# Patient Record
Sex: Male | Born: 1960 | Race: Black or African American | Hispanic: No | Marital: Married | Smoking: Current every day smoker
Health system: Southern US, Community
[De-identification: ages and names within clinical notes are randomized; demographics above are authoritative.]

## PROBLEM LIST (undated history)

## (undated) DIAGNOSIS — I639 Cerebral infarction, unspecified: Secondary | ICD-10-CM

## (undated) DIAGNOSIS — M519 Unspecified thoracic, thoracolumbar and lumbosacral intervertebral disc disorder: Secondary | ICD-10-CM

---

## 2013-05-10 ENCOUNTER — Encounter (HOSPITAL_COMMUNITY): Payer: Self-pay | Admitting: Emergency Medicine

## 2013-05-10 ENCOUNTER — Emergency Department (HOSPITAL_COMMUNITY)
Admission: EM | Admit: 2013-05-10 | Discharge: 2013-05-10 | Disposition: A | Discharge status: Home / Self Care | Payer: Self-pay | Attending: Emergency Medicine | Admitting: Emergency Medicine

## 2013-05-10 DIAGNOSIS — Z8739 Personal history of other diseases of the musculoskeletal system and connective tissue: Secondary | ICD-10-CM | POA: Insufficient documentation

## 2013-05-10 DIAGNOSIS — T63461A Toxic effect of venom of wasps, accidental (unintentional), initial encounter: Secondary | ICD-10-CM | POA: Insufficient documentation

## 2013-05-10 DIAGNOSIS — Y9389 Activity, other specified: Secondary | ICD-10-CM | POA: Insufficient documentation

## 2013-05-10 DIAGNOSIS — F17200 Nicotine dependence, unspecified, uncomplicated: Secondary | ICD-10-CM | POA: Insufficient documentation

## 2013-05-10 DIAGNOSIS — T6391XA Toxic effect of contact with unspecified venomous animal, accidental (unintentional), initial encounter: Secondary | ICD-10-CM | POA: Insufficient documentation

## 2013-05-10 DIAGNOSIS — J029 Acute pharyngitis, unspecified: Secondary | ICD-10-CM | POA: Insufficient documentation

## 2013-05-10 DIAGNOSIS — Y929 Unspecified place or not applicable: Secondary | ICD-10-CM | POA: Insufficient documentation

## 2013-05-10 HISTORY — DX: Unspecified thoracic, thoracolumbar and lumbosacral intervertebral disc disorder: M51.9

## 2013-05-10 MED ORDER — PREDNISONE 10 MG PO TABS
60.0000 mg | ORAL_TABLET | Freq: Once | ORAL | Status: AC
  Administered 2013-05-10: 22:00:00 60 mg via ORAL
  Filled 2013-05-10: qty 1

## 2013-05-10 MED ORDER — DIPHENHYDRAMINE HCL 25 MG PO CAPS
50.0000 mg | ORAL_CAPSULE | Freq: Once | ORAL | Status: AC
  Administered 2013-05-10: 50 mg via ORAL
  Filled 2013-05-10: qty 2

## 2013-05-10 NOTE — ED Notes (Signed)
Patient's wife states patient was out with some friends drinking and got multiple yellow jacket stings.

## 2013-05-10 NOTE — ED Provider Notes (Signed)
CSN: [id number]     Arrival date & time 05/10/13  2121 History  This chart was scribed for Geoffery Lyons, MD by Clydene Laming, ED Scribe. This patient was seen in room  and the patient's care was started at 9:30 PM.   Chief Complaint  Patient presents with  . Insect Bite   The history is provided by the patient. No language interpreter was used.     HPI Comments: Fred Bell is a 52 y.o. male who presents to the Emergency Department complaining of multiple yellow jacket stings that occurred tonight while out drinking with some friends. Pt states he is experiencing some trouble breathing with a swollen throat. Pt states a previous episode of stings but was not this severe. Pt states he does not want to be treated with any needles. Pt states he prefers medication pills.       Past Medical History  Diagnosis Date  . Lumbar disc disease    History reviewed. No pertinent past surgical history. No family history on file. History  Substance Use Topics  . Smoking status: Current Every Day Smoker  . Smokeless tobacco: Not on file  . Alcohol Use: Yes    Review of Systems  HENT: Positive for sore throat. Negative for neck pain.   Respiratory: Negative for choking and shortness of breath.   Skin: Positive for wound. Negative for rash.  Neurological: Negative for dizziness, numbness and headaches.  All other systems reviewed and are negative.    Allergies  Review of patient's allergies indicates no known allergies.  Home Medications  No current outpatient prescriptions on file.  Triage Vitals: BP 166/109  Pulse 87  Temp(Src) 98.4 F (36.9 C) (Oral)  Resp 18  Ht 6' (1.829 m)  Wt 180 lb (81.647 kg)  BMI 24.41 kg/m2  SpO2 99% Physical Exam  Nursing note and vitals reviewed. Constitutional: He is oriented to person, place, and time. He appears well-developed and well-nourished. No distress.  HENT:  Head: Normocephalic and atraumatic.  Eyes: Conjunctivae and EOM are normal.   Neck: Normal range of motion. Neck supple. No tracheal deviation present.  Cardiovascular: Normal rate, regular rhythm and normal heart sounds.   Pulmonary/Chest: Effort normal and breath sounds normal.  Musculoskeletal: Normal range of motion. He exhibits no edema.  Neurological: He is alert and oriented to person, place, and time. No cranial nerve deficit.  Skin: Skin is warm and dry.  Psychiatric: He has a normal mood and affect. His behavior is normal.    ED Course  Procedures (including critical care time)  DIAGNOSTIC STUDIES: Oxygen Saturation is 99% on RA, normal by my interpretation.    COORDINATION OF CARE: 9:35 PM- Discussed treatment plan with pt at bedside. Pt verbalized understanding and agreement with plan.   Labs Review Labs Reviewed - No data to display Imaging Review No results found.  MDM  No diagnosis found. Patient is a 52 year old male who presents with complaints of multiple bee stings to the back of his head and neck. He states that he feels like his throat is swelling. He presents here obviously intoxicated and having no difficulty speaking and no stridor. His vital signs are stable and he is showing no signs of anaphylaxis. As he was complaining of throat swelling I had intended to give IV steroids and antihistamines. He adamantly refused this and these medications were given orally. He was watched for a period of over an hour and is situation did not worsen. At this point I  feel as though he is stable for discharge, to return when necessary for any problems.  I personally performed the services described in this documentation, which was scribed in my presence. The recorded information has been reviewed and is accurate.     Geoffery Lyons, MD 05/10/13 2249

## 2014-11-28 ENCOUNTER — Inpatient Hospital Stay (HOSPITAL_COMMUNITY)
Admission: EM | Admit: 2014-11-28 | Discharge: 2014-11-30 | DRG: 065 | Disposition: A | Discharge status: Home Health | Payer: Medicaid Other | Attending: Family Medicine | Admitting: Family Medicine

## 2014-11-28 ENCOUNTER — Observation Stay (HOSPITAL_COMMUNITY): Payer: Medicaid Other

## 2014-11-28 ENCOUNTER — Encounter (HOSPITAL_COMMUNITY): Payer: Self-pay

## 2014-11-28 ENCOUNTER — Emergency Department (HOSPITAL_COMMUNITY): Payer: Medicaid Other

## 2014-11-28 DIAGNOSIS — I639 Cerebral infarction, unspecified: Secondary | ICD-10-CM | POA: Diagnosis present

## 2014-11-28 DIAGNOSIS — F1010 Alcohol abuse, uncomplicated: Secondary | ICD-10-CM | POA: Diagnosis present

## 2014-11-28 DIAGNOSIS — R531 Weakness: Secondary | ICD-10-CM | POA: Diagnosis present

## 2014-11-28 DIAGNOSIS — Z720 Tobacco use: Secondary | ICD-10-CM | POA: Diagnosis present

## 2014-11-28 DIAGNOSIS — F17210 Nicotine dependence, cigarettes, uncomplicated: Secondary | ICD-10-CM | POA: Diagnosis present

## 2014-11-28 DIAGNOSIS — F70 Mild intellectual disabilities: Secondary | ICD-10-CM | POA: Diagnosis present

## 2014-11-28 DIAGNOSIS — R29810 Facial weakness: Secondary | ICD-10-CM | POA: Diagnosis present

## 2014-11-28 DIAGNOSIS — G8194 Hemiplegia, unspecified affecting left nondominant side: Secondary | ICD-10-CM | POA: Diagnosis present

## 2014-11-28 DIAGNOSIS — R1310 Dysphagia, unspecified: Secondary | ICD-10-CM

## 2014-11-28 DIAGNOSIS — I63511 Cerebral infarction due to unspecified occlusion or stenosis of right middle cerebral artery: Principal | ICD-10-CM | POA: Diagnosis present

## 2014-11-28 DIAGNOSIS — F1210 Cannabis abuse, uncomplicated: Secondary | ICD-10-CM | POA: Diagnosis present

## 2014-11-28 LAB — COMPREHENSIVE METABOLIC PANEL
ALBUMIN: 4.5 g/dL (ref 3.5–5.2)
ALT: 24 U/L (ref 0–53)
AST: 49 U/L — AB (ref 0–37)
Alkaline Phosphatase: 78 U/L (ref 39–117)
Anion gap: 10 (ref 5–15)
BILIRUBIN TOTAL: 0.8 mg/dL (ref 0.3–1.2)
BUN: 9 mg/dL (ref 6–23)
CHLORIDE: 108 mmol/L (ref 96–112)
CO2: 21 mmol/L (ref 19–32)
CREATININE: 0.91 mg/dL (ref 0.50–1.35)
Calcium: 9.1 mg/dL (ref 8.4–10.5)
GFR calc Af Amer: >90 mL/min (ref >90)
GFR calc non Af Amer: >90 mL/min (ref >90)
Glucose, Bld: 86 mg/dL (ref 70–99)
Potassium: 4.2 mmol/L (ref 3.5–5.1)
Sodium: 139 mmol/L (ref 135–145)
Total Protein: 7.6 g/dL (ref 6.0–8.3)

## 2014-11-28 LAB — LIPID PANEL
Cholesterol: 161 mg/dL (ref 0–200)
HDL: 71 mg/dL (ref >39)
LDL Cholesterol: 81 mg/dL (ref 0–99)
Total CHOL/HDL Ratio: 2.3 RATIO
Triglycerides: 45 mg/dL (ref <150)
VLDL: 9 mg/dL (ref 0–40)

## 2014-11-28 LAB — PROTIME-INR
INR: 0.98 (ref 0.00–1.49)
PROTHROMBIN TIME: 13.1 s (ref 11.6–15.2)

## 2014-11-28 LAB — CBC
HCT: 42.4 % (ref 39.0–52.0)
HEMOGLOBIN: 14.3 g/dL (ref 13.0–17.0)
MCH: 30.7 pg (ref 26.0–34.0)
MCHC: 33.7 g/dL (ref 30.0–36.0)
MCV: 91 fL (ref 78.0–100.0)
PLATELETS: 200 10*3/uL (ref 150–400)
RBC: 4.66 MIL/uL (ref 4.22–5.81)
RDW: 13.6 % (ref 11.5–15.5)
WBC: 6.7 10*3/uL (ref 4.0–10.5)

## 2014-11-28 LAB — DIFFERENTIAL
BASOS ABS: 0 10*3/uL (ref 0.0–0.1)
BASOS PCT: 0 % (ref 0–1)
EOS ABS: 0 10*3/uL (ref 0.0–0.7)
Eosinophils Relative: 0 % (ref 0–5)
Lymphocytes Relative: 28 % (ref 12–46)
Lymphs Abs: 1.8 10*3/uL (ref 0.7–4.0)
MONOS PCT: 6 % (ref 3–12)
Monocytes Absolute: 0.4 10*3/uL (ref 0.1–1.0)
NEUTROS ABS: 4.4 10*3/uL (ref 1.7–7.7)
NEUTROS PCT: 66 % (ref 43–77)

## 2014-11-28 LAB — URINALYSIS, ROUTINE W REFLEX MICROSCOPIC
Bilirubin Urine: NEGATIVE
GLUCOSE, UA: NEGATIVE mg/dL
HGB URINE DIPSTICK: NEGATIVE
KETONES UR: 15 mg/dL — AB
LEUKOCYTES UA: NEGATIVE
Nitrite: NEGATIVE
PROTEIN: NEGATIVE mg/dL
Specific Gravity, Urine: >1.03 — ABNORMAL HIGH (ref 1.005–1.030)
UROBILINOGEN UA: 0.2 mg/dL (ref 0.0–1.0)
pH: 5.5 (ref 5.0–8.0)

## 2014-11-28 LAB — ETHANOL: Alcohol, Ethyl (B): 18 mg/dL — ABNORMAL HIGH (ref 0–9)

## 2014-11-28 LAB — RAPID URINE DRUG SCREEN, HOSP PERFORMED
Amphetamines: NOT DETECTED
BARBITURATES: NOT DETECTED
BENZODIAZEPINES: NOT DETECTED
COCAINE: NOT DETECTED
Opiates: NOT DETECTED
TETRAHYDROCANNABINOL: POSITIVE — AB

## 2014-11-28 LAB — APTT: APTT: 26 s (ref 24–37)

## 2014-11-28 LAB — I-STAT TROPONIN, ED: TROPONIN I, POC: 0 ng/mL (ref 0.00–0.08)

## 2014-11-28 MED ORDER — SODIUM CHLORIDE 0.9 % IV SOLN
INTRAVENOUS | Status: DC
  Administered 2014-11-28: 13:00:00 via INTRAVENOUS

## 2014-11-28 MED ORDER — LORAZEPAM 2 MG/ML IJ SOLN: 1.0000 mg | Freq: Once | INTRAMUSCULAR | Status: DC

## 2014-11-28 MED ORDER — ASPIRIN 300 MG RE SUPP
325.0000 mg | Freq: Once | RECTAL | Status: AC
  Administered 2014-11-28: 300 mg via RECTAL
  Filled 2014-11-28: qty 1

## 2014-11-28 MED ORDER — SIMVASTATIN 20 MG PO TABS
20.0000 mg | ORAL_TABLET | Freq: Every day | ORAL | Status: DC
  Administered 2014-11-29: 20 mg via ORAL
  Filled 2014-11-28 (×2): qty 1

## 2014-11-28 MED ORDER — SENNOSIDES-DOCUSATE SODIUM 8.6-50 MG PO TABS: 1.0000 | ORAL_TABLET | Freq: Every evening | ORAL | Status: DC | PRN

## 2014-11-28 MED ORDER — STROKE: EARLY STAGES OF RECOVERY BOOK
Freq: Once | Status: AC
  Administered 2014-11-28: 18:00:00
  Filled 2014-11-28: qty 1

## 2014-11-28 MED ORDER — ASPIRIN 325 MG PO TABS
325.0000 mg | ORAL_TABLET | Freq: Every day | ORAL | Status: DC
  Filled 2014-11-28: qty 1

## 2014-11-28 NOTE — Plan of Care (Signed)
Problem: Consults Goal: Ischemic Stroke Patient Education See Patient Education Module for education specifics.  Outcome: Completed/Met Date Met:  11/28/14 Stroke booklet given to patient and discussed with patient's spouse.

## 2014-11-28 NOTE — Progress Notes (Signed)
Stroke swallow screen repeated per SLP and NP recommendation. Patient was able to tolerate fluids, but choked on cracker. Will keep patient NPO until SLP can see him.

## 2014-11-28 NOTE — H&P (Signed)
[facility] History and Physical  Fred RedoOwen Tadesse [id number] DOB: 04/23/1961 DOA: 11/28/2014  Referring physician:  PCP: Toma DeitersHASANAJ,XAJE A, MD   Chief Complaint: left facial droop slurred speech left arm weakness  HPI: Fred Bell is a 54 y.o. male with no medical hx presents to ED with cc left sided weakness with facial droop. Initial evaluation in the emergency department reveals an acute right MCA infarct with minimal to mild mass effect right lateral ventricle and trace midline shift.  Patient reports he awakened this morning got out of bed slipped on some water and fell and hit his head. He denies having difficulty getting up off the floor and reports that he went back to bed without pain. About 7 AM his wife came home after working the night shift and immediately noticed the left side of his face was drooping and his speech was somewhat slurred. He got up to go take a shower his gait was unsteady in his left arm was limp by his side. She called EMS he was transported to the emergency department. He denies chest pain palpitations headache visual does disturbances syncope or near-syncope. He denies abdominal pain nausea vomiting diarrhea. He does report he drinks an average of 1-2 beers per day and that last night he might have had "more than a couple of beers". He admits to occasional THC use but denies cocaine crack and heroin. He does smoke. Time of onset of symptoms is unknown but he was last known well at 6 PM yesterday. Her cup in the emergency department includes comprehensive metabolic panel that is unremarkable, CBC with differential is unremarkable EKG on this rhythm biatrial enlargement and CT of the head as noted above. He is hemodynamically stable afebrile and not hypoxic .  Review of Systems:  10 point review of systems complete and all systems are negative except as indicated in the history of present illness  Past Medical History  Diagnosis Date  . Lumbar disc disease     History reviewed. No pertinent past surgical history. Social History:  reports that he has been smoking.  He does not have any smokeless tobacco history on file. He reports that he drinks alcohol. He reports that he uses illicit drugs (Marijuana). Married lives with wife is in [employer] independent with ADLs No Known Allergies  No family history on file. others deceased of unknown causes mother alive 52 years old history of hypertension diabetes has 2 siblings neither of which have a past medical history  Prior to Admission medications   Medication Sig Start Date End Date Taking? Authorizing Provider  traMADol (ULTRAM) 50 MG tablet Take 50 mg by mouth every 6 (six) hours as needed for pain.   Yes Historical Provider, MD   Physical Exam: Filed Vitals:   11/28/14 0844 11/28/14 0915 11/28/14 0930 11/28/14 1000  BP:  123/92 119/80 118/61  Pulse:  53 54 56  Temp: 98.8 F (37.1 C)     TempSrc:      Resp:  15 13 14   Height:      Weight:      SpO2:  100% 100% 99%    Wt Readings from Last 3 Encounters:  11/28/14 81.647 kg (180 lb)  05/10/13 81.647 kg (180 lb)    General:  Appears calm and comfortable Eyes: PERRL, normal lids, irises & conjunctiva ENT: grossly normal hearing, lips & tongue his membranes of his mouth are moist and pink Neck: no LAD, masses or thyromegaly Cardiovascular: RRR, no m/r/g.  No LE edema.  Respiratory: CTA bilaterally, no w/r/r. Normal respiratory effort. Abdomen: soft, ntnd positive bowel sounds Skin: no rash or induration seen on limited exam Musculoskeletal: grossly normal tone BUE/BLE Psychiatric: grossly normal mood and affect, speech fluent and appropriate Neurologic: Left facial droop, UE strength 3/5 on left 5/5 on right. LE strength 3/5 on left 5/5 on right. Left arm drip          Labs on Admission:  Basic Metabolic Panel:  Recent Labs Lab 11/28/14 0820  NA 139  K 4.2  CL 108  CO2 21  GLUCOSE 86  BUN 9   CREATININE 0.91  CALCIUM 9.1   Liver Function Tests:  Recent Labs Lab 11/28/14 0820  AST 49*  ALT 24  ALKPHOS 78  BILITOT 0.8  PROT 7.6  ALBUMIN 4.5   No results for input(s): LIPASE, AMYLASE in the last 168 hours. No results for input(s): AMMONIA in the last 168 hours. CBC:  Recent Labs Lab 11/28/14 0820  WBC 6.7  NEUTROABS 4.4  HGB 14.3  HCT 42.4  MCV 91.0  PLT 200   Cardiac Enzymes: No results for input(s): CKTOTAL, CKMB, CKMBINDEX, TROPONINI in the last 168 hours.  BNP (last 3 results) No results for input(s): BNP in the last 8760 hours.  ProBNP (last 3 results) No results for input(s): PROBNP in the last 8760 hours.  CBG: No results for input(s): GLUCAP in the last 168 hours.  Radiological Exams on Admission: Ct Head Wo Contrast  11/28/2014   CLINICAL DATA:  54 year old male with left side weakness and headache for 12-24 hours. Initial encounter.  EXAM: CT HEAD WITHOUT CONTRAST  TECHNIQUE: Contiguous axial images were obtained from the base of the skull through the vertex without intravenous contrast.  COMPARISON:  [facility] head and cervical spine CT 06/18/2011.  FINDINGS: Visualized paranasal sinuses and mastoids are clear. No acute osseous abnormality identified. Visualized orbits and scalp soft tissues are within normal limits.  Abnormal loss of gray-white matter differentiation in the right MCA territory. Insula and operculum most affected (series 2, image 17). Hyperdense right MCA visible on image 13.  ASPECTS score is 6 (insula, M2, M5, and also M4 segments are abnormal).  Minimal to mild mass effect on the right lateral ventricle. Trace if any midline shift. No acute intracranial hemorrhage identified.  No ventriculomegaly. Patchy cerebral white matter hypodensity elsewhere appears stable. Stable gray-white matter differentiation elsewhere.  IMPRESSION: 1. Acute right MCA infarct. Hyperdense right MCA bifurcation/M2 branch is visible. ASPECTS  score is 6. 2. No associated hemorrhage.  Minimal to mild mass effect. 3. Study discussed by telephone with Zadie Rhine on 11/28/2014 at 0834 hours.   Electronically Signed   By: Odessa Fleming M.D.   On: 11/28/2014 08:40    EKG: Independently reviewed. SR with biatrial enlargement  Assessment/Plan Principal Problem:   Acute right MCA stroke: Will admit to telemetry. we'll obtain MRI carotid Dopplers 2-D echo. Will check hemoglobin A1c lipid panel. Monitor vital signs and neuro exam closely per protocol.  Keep nothing by mouth until speech therapy evaluates given that he failed his bedside swallow test. Lovenox for now until results of MRI known. PT and OT evaluations.  Active Problems:     Left-sided weakness: Improving at the time of my exam according to the wife. Related to above. PT NOT    Dysphagia: Some choking and coughing with bedside swallow evaluation. Will keep nothing by mouth until evaluated by speech therapy.  Tobacco abuse: cessation counseling      Code Status: full DVT Prophylaxis: Family Communication: wife at bedside Disposition Plan: home hopefully 24 hours  Time spent: 65 minutes  [facility] Pager [phone]

## 2014-11-28 NOTE — Progress Notes (Signed)
SLP Cancellation Note  Patient Details Name: Fred Bell MRN: [id number] DOB: 02/22/1961   Cancelled treatment:       Reason Eval/Treat Not Completed: Patient at procedure or test/unavailable; Pt still off floor for procedures/testing so unable to complete BSE at this time. Page sent to [employer]. Could consider repeating RN swallow screen pending results of imaging. May need to defer BSE until Friday.  Thank you,  Havery MorosDabney Porter, CCC-SLP [phone]    PORTER,DABNEY 11/28/2014, 4:00 PM

## 2014-11-28 NOTE — ED Notes (Addendum)
Labs drawn by nurse with IV stick. Specimens delivered to lab by nurse. Pt to CT. Pt was uncooperative during IV stick. His wife had to help hold his arm. Wife and pt are arguing at bedside due to pt "partying all night last night"

## 2014-11-28 NOTE — ED Provider Notes (Signed)
CSN: [id number]     Arrival date & time 11/28/14  [id number] History  This chart was scribed for Fred Rhineonald Kyndle Schlender, MD by Ronney LionSuzanne Le, ED Scribe. This patient was seen in room APA02/APA02 and the patient's care was started at 8:03 AM.    Chief Complaint  Patient presents with  . Cerebrovascular Accident   Patient is a 54 y.o. male presenting with weakness. The history is provided by the patient and the spouse. No language interpreter was used.  Weakness This is a new problem. The current episode started 12 to 24 hours ago (LKW 6 PM yesterday). The problem occurs rarely. The problem has not changed since onset.Associated symptoms include headaches (mild). Pertinent negatives include no chest pain, no abdominal pain and no shortness of breath. Nothing aggravates the symptoms. Nothing relieves the symptoms. He has tried nothing for the symptoms.     HPI Comments: Fred Bell is a 54 y.o. male who presents to the Emergency Department complaining of left sided weakness that his wife noticed when he woke up at 7 AM this morning, about 1 hour ago. Patient was last known well at 6 PM yesterday. Patient endorses a mild headache. Wife denies a history of CVA or any chronic medical conditions. Patient denies SOB, abdominal pain, vomiting, diarrhea, or fever.  Patient's PCP is Dr. Olena LeatherwoodHasanaj. He does not see a neurologist.  Past Medical History  Diagnosis Date  . Lumbar disc disease    History reviewed. No pertinent past surgical history. No family history on file. History  Substance Use Topics  . Smoking status: Current Every Day Smoker  . Smokeless tobacco: Not on file  . Alcohol Use: Yes     Comment: beer daily    Review of Systems  Constitutional: Negative for fever.  Respiratory: Negative for shortness of breath.   Cardiovascular: Negative for chest pain.  Gastrointestinal: Negative for vomiting, abdominal pain and diarrhea.  Neurological: Positive for weakness (left-sided) and headaches (mild).  All  other systems reviewed and are negative.     Allergies  Review of patient's allergies indicates no known allergies.  Home Medications   Prior to Admission medications   Medication Sig Start Date End Date Taking? Authorizing Provider  ibuprofen (ADVIL,MOTRIN) 800 MG tablet Take 800 mg by mouth every 8 (eight) hours as needed for pain.    Historical Provider, MD  lidocaine (LIDODERM) 5 % Place 1 patch onto the skin daily. Remove & Discard patch within 12 hours or as directed by MD    Historical Provider, MD  naproxen (NAPROSYN) 500 MG tablet Take 500 mg by mouth 2 (two) times daily with a meal.    Historical Provider, MD  traMADol (ULTRAM) 50 MG tablet Take 50 mg by mouth every 6 (six) hours as needed for pain.    Historical Provider, MD   BP 122/71 mmHg  Pulse 65  Temp(Src) 98.8 F (37.1 C) (Oral)  Resp 20  Ht 6' (1.829 m)  Wt 180 lb (81.647 kg)  BMI 24.41 kg/m2  SpO2 99% Physical Exam  Nursing note and vitals reviewed.  CONSTITUTIONAL: Well developed/well nourished HEAD: Normocephalic/atraumatic EYES: EOMI/PERRL, no nystagmus, no visual field deficit  no ptosis ENMT: Mucous membranes moist NECK: supple no meningeal signs, no bruits CV: S1/S2 noted, no murmurs/rubs/gallops noted LUNGS: Lungs are clear to auscultation bilaterally, no apparent distress ABDOMEN: soft, nontender, no rebound or guarding GU:no cva tenderness NEURO:Awake/alert, left facial droop, left arm drift, mild left leg drift, no visual field deficit EXTREMITIES: pulses normal,  full ROM SKIN: warm, color normal PSYCH: no abnormalities of mood noted    ED Course  Procedures   DIAGNOSTIC STUDIES: Oxygen Saturation is 99% on room air, normal by my interpretation.    COORDINATION OF CARE: 8:06 AM - Discussed treatment plan with pt at bedside which includes CT scans, blood tests, and hospital admission, and pt agreed to plan.  tPA in stroke considered but not given due to: Onset over 3-4.5hours  8:39  AM D/w radiology.  CT findings c/w acute ischemic CVA I spoke to patient/wife again.  Unclear time of onset as patient denies feeling weak so he doesn't know when it started.  Wife reports she saw him at 6pm on 11/27/14 and he was normal.  She spoke to him around 8pm and he sounded slurred.  Apparently pt did admit to "partying last night" 9:37 AM D/w dr Thad Ranger neuro [facility] No acute intervention advised due to >12 hrs since LKW D/w dr Irene Limbo will admit to medicine   Labs Review Labs Reviewed  ETHANOL - Abnormal; Notable for the following:    Alcohol, Ethyl (B) 18 (*)    All other components within normal limits  COMPREHENSIVE METABOLIC PANEL - Abnormal; Notable for the following:    AST 49 (*)    All other components within normal limits  PROTIME-INR  APTT  CBC  DIFFERENTIAL  URINE RAPID DRUG SCREEN (HOSP PERFORMED)  URINALYSIS, ROUTINE W REFLEX MICROSCOPIC  I-STAT TROPOININ, ED    Imaging Review Ct Head Wo Contrast  11/28/2014   CLINICAL DATA:  54 year old male with left side weakness and headache for 12-24 hours. Initial encounter.  EXAM: CT HEAD WITHOUT CONTRAST  TECHNIQUE: Contiguous axial images were obtained from the base of the skull through the vertex without intravenous contrast.  COMPARISON:  [facility] head and cervical spine CT 06/18/2011.  FINDINGS: Visualized paranasal sinuses and mastoids are clear. No acute osseous abnormality identified. Visualized orbits and scalp soft tissues are within normal limits.  Abnormal loss of gray-white matter differentiation in the right MCA territory. Insula and operculum most affected (series 2, image 17). Hyperdense right MCA visible on image 13.  ASPECTS score is 6 (insula, M2, M5, and also M4 segments are abnormal).  Minimal to mild mass effect on the right lateral ventricle. Trace if any midline shift. No acute intracranial hemorrhage identified.  No ventriculomegaly. Patchy cerebral white matter hypodensity  elsewhere appears stable. Stable gray-white matter differentiation elsewhere.  IMPRESSION: 1. Acute right MCA infarct. Hyperdense right MCA bifurcation/M2 branch is visible. ASPECTS score is 6. 2. No associated hemorrhage.  Minimal to mild mass effect. 3. Study discussed by telephone with Fred Rhine on 11/28/2014 at 0834 hours.   Electronically Signed   By: Odessa Fleming M.D.   On: 11/28/2014 08:40    ED ECG REPORT   Date: 11/28/2014 9604  Rate: 65  Rhythm: normal sinus rhythm  QRS Axis: normal  Intervals: normal  ST/T Wave abnormalities: nonspecific ST changes  Conduction Disutrbances:none (unable to confirm in MUSE)    MDM   Final diagnoses:  Stroke    Nursing notes including past medical history and social history reviewed and considered in documentation Labs/vital reviewed myself and considered during evaluation xrays/imaging reviewed by myself and considered during evaluation Discussed CT findings with radiology    I personally performed the services described in this documentation, which was scribed in my presence. The recorded information has been reviewed and is accurate.     Fred Rhine,  MD 11/28/14 [phone]

## 2014-11-28 NOTE — ED Notes (Signed)
Ems reports pt woke up with left sided weakness, left sided facial droop, slurred speech, and difficulty ambulating.  Reports wife noticed symptoms at 0700 this morning.  Reports pt was last seen normal at 6pm yesterday.

## 2014-11-28 NOTE — ED Notes (Signed)
Attempted to call report. Nurse to call back. 

## 2014-11-28 NOTE — ED Notes (Signed)
Hospitalist remains at bedside

## 2014-11-29 DIAGNOSIS — R531 Weakness: Secondary | ICD-10-CM | POA: Diagnosis not present

## 2014-11-29 DIAGNOSIS — F17210 Nicotine dependence, cigarettes, uncomplicated: Secondary | ICD-10-CM | POA: Diagnosis present

## 2014-11-29 DIAGNOSIS — G8194 Hemiplegia, unspecified affecting left nondominant side: Secondary | ICD-10-CM | POA: Diagnosis present

## 2014-11-29 DIAGNOSIS — G464 Cerebellar stroke syndrome: Secondary | ICD-10-CM

## 2014-11-29 DIAGNOSIS — R29810 Facial weakness: Secondary | ICD-10-CM | POA: Diagnosis present

## 2014-11-29 DIAGNOSIS — F1010 Alcohol abuse, uncomplicated: Secondary | ICD-10-CM | POA: Diagnosis present

## 2014-11-29 DIAGNOSIS — R1310 Dysphagia, unspecified: Secondary | ICD-10-CM | POA: Diagnosis present

## 2014-11-29 DIAGNOSIS — F1210 Cannabis abuse, uncomplicated: Secondary | ICD-10-CM | POA: Diagnosis present

## 2014-11-29 DIAGNOSIS — I63511 Cerebral infarction due to unspecified occlusion or stenosis of right middle cerebral artery: Secondary | ICD-10-CM | POA: Diagnosis present

## 2014-11-29 DIAGNOSIS — F70 Mild intellectual disabilities: Secondary | ICD-10-CM | POA: Diagnosis present

## 2014-11-29 LAB — HEMOGLOBIN A1C
Hgb A1c MFr Bld: 6.2 % — ABNORMAL HIGH (ref 4.8–5.6)
Mean Plasma Glucose: 131 mg/dL

## 2014-11-29 MED ORDER — ACETAMINOPHEN 325 MG PO TABS
650.0000 mg | ORAL_TABLET | Freq: Four times a day (QID) | ORAL | Status: DC | PRN
  Administered 2014-11-29: 650 mg via ORAL
  Filled 2014-11-29: qty 2

## 2014-11-29 MED ORDER — ACETAMINOPHEN 325 MG PO TABS
650.0000 mg | ORAL_TABLET | ORAL | Status: DC | PRN
  Administered 2014-11-29 – 2014-11-30 (×5): 650 mg via ORAL
  Filled 2014-11-29 (×6): qty 2

## 2014-11-29 MED ORDER — ASPIRIN 325 MG PO TABS
325.0000 mg | ORAL_TABLET | Freq: Every day | ORAL | Status: DC
  Administered 2014-11-30: 325 mg via ORAL
  Filled 2014-11-29: qty 1

## 2014-11-29 MED ORDER — ENOXAPARIN SODIUM 30 MG/0.3ML ~~LOC~~ SOLN
30.0000 mg | SUBCUTANEOUS | Status: DC
  Administered 2014-11-29 – 2014-11-30 (×2): 30 mg via SUBCUTANEOUS
  Filled 2014-11-29 (×2): qty 0.3

## 2014-11-29 NOTE — Evaluation (Signed)
Clinical/Bedside Swallow Evaluation Patient Details  Name: Fred Bell MRN: [id number] Date of Birth: 10/05/1960  Today's Date: 11/29/2014 Time: SLP Start Time (ACUTE ONLY): 0645 SLP Stop Time (ACUTE ONLY): 0730 SLP Time Calculation (min) (ACUTE ONLY): 45 min  Past Medical History:  Past Medical History  Diagnosis Date  . Lumbar disc disease    Past Surgical History: History reviewed. No pertinent past surgical history. HPI:  Fred Bell is a 54 yo male who presented to [facility] ER with left sided weakness which was first identified at 7 AM yesterday (last known well at 6 PM prior evening). CT showed acute ischemic CVA (no acute neuro intervention due to >12 hours since last known well). MRI shows: There is acute infarction affecting approximately 50% of the right MCA territory including the right temporal lobe, much of the insular region, in the posterior frontal cortical and subcortical brain. Overall area of involvement measures approximately 6 cm in diameter. There is mild brain swelling but no evidence of hemorrhage or mass effect. He does report he drinks an average of 1-2 beers per day and that last night he might have had "more than a couple of beers". [facility] and smokes tobacco. 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 Pt failed RN swallow screen (coughed on water initially and then with crackers). Wife describes her husband as impulsive at baseline. SLP ordered as part of stroke protocol.   Assessment / Plan / Recommendation Clinical Impression  Fred Bell was seen at bedside for clinical swallow evaluation with wife present. He presents with notable impulsivity and left sided oralmotor weakness which negatively impacts safe and efficient swallow function. Pt also with decreased sustained attention and variable willingness to follow directions. Pt required tactile and verbal cues for pacing intake. His wife is very good about  cueing him and stopping him when needed. Although no overt coughing/choking elicited this AM, pt is at risk for aspiration given impulsivity and facial weakness. Wife states that her husband has always eaten very quickly, however I strongly suspect this has been exacerbated in setting of acute CVA. He has difficulty with self regulation and will need 100% supervision with all po intake. His wife states that she absolutely will be able to provide this.   I attempted to further assess his cognition, however after eating pt wanted to "go to sleep" and rolled over and pulled blanket over him. I spoke with his wife at length about any changes she has noted in cognition, however she is unable (unwilling?) to identify any. He will need 100% supervision until PT is able to assess him due to his impulsivity. I broached the topic of acute rehab, however wife is adamant that she will "not put him away somewhere" (she works at [employer] and [employer]). Pt also does not have insurance and hopes to apply for Medicaid. 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   Recommend D3/mech soft and thin liquids with 100% supervision. If pt cannot be supervised, he will need to be changed to puree and nectar thick liquids. Also recommend outpatient ST (cognition) to maximize safety and independance and decrease burden of care.    Aspiration Risk  Mild    Diet Recommendation Dysphagia 3 (Mechanical Soft);Thin liquid   Liquid Administration via: Cup;Straw Medication Administration: Whole meds with puree Supervision: Patient able to self feed;Full supervision/cueing for compensatory  strategies Compensations: Slow rate;Small sips/bites;Check for pocketing;Check for anterior loss;Multiple dry swallows after each bite/sip;Follow solids with liquid Postural Changes and/or Swallow Maneuvers: Seated upright 90 degrees;Upright 30-60 min after meal    Other  Recommendations Oral Care  Recommendations: Oral care BID Other Recommendations: Clarify dietary restrictions   Follow Up Recommendations  Outpatient SLP (pt with no insurance and wife does not want rehab)    Frequency and Duration min 2x/week  1 week   Pertinent Vitals/Pain VSS    SLP Swallow Goals   Pt will demonstrate safe and efficient consumption of least restrictive diet with use of strategies as needed.  Swallow Study Prior Functional Status       General Date of Onset: 11/28/14 HPI: Fred Bell is a 54 yo male who presented to [facility] ER with left sided weakness which was first identified at 7 AM yesterday (last known well at 6 PM prior evening). CT showed acute ischemic CVA (no acute neuro intervention due to >12 hours since last known well). MRI shows: There is acute infarction affecting approximately 50% of the right MCA territory including the right temporal lobe, much of the insular region, in the posterior frontal cortical and subcortical brain. Overall area of involvement measures approximately 6 cm in diameter. There is mild brain swelling but no evidence of hemorrhage or mass effect. He does report he drinks an average of 1-2 beers per day and that last night he might have had "more than a couple of beers". He admits to occasional [employer] and smokes tobacco. 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 Pt failed RN swallow screen (coughed on water initially and then with crackers). Wife describes her husband as impulsive at baseline. SLP ordered as part of stroke protocol. Type of Study: Bedside swallow evaluation Previous Swallow Assessment: N/A Diet Prior to this Study: NPO Temperature Spikes Noted: No Respiratory Status: Room air History of Recent Intubation: No Behavior/Cognition: Alert;Cooperative;Impulsive;Requires cueing;Decreased sustained attention Oral Cavity - Dentition: Adequate natural dentition Self-Feeding Abilities: Able to feed self;Needs  assist (due to impulsivity) Patient Positioning: Upright in bed Baseline Vocal Quality: Clear Volitional Cough: Strong Volitional Swallow: Able to elicit    Oral/Motor/Sensory Function Overall Oral Motor/Sensory Function: Impaired Labial ROM: Reduced left Labial Symmetry: Abnormal symmetry left Labial Strength: Reduced Labial Sensation: Reduced (although pt verbally denies but not sensate to labial spilla) Lingual ROM: Reduced left Lingual Symmetry: Abnormal symmetry left Lingual Strength: Reduced Lingual Sensation: Within Functional Limits Facial ROM: Reduced left Facial Symmetry: Left droop Facial Strength: Reduced Facial Sensation: Within Functional Limits Velum:  (difficult to assess) Mandible: Within Functional Limits   Ice Chips Ice chips: Within functional limits Presentation: Spoon   Thin Liquid Thin Liquid: Impaired Presentation: Cup;Self Fed;Straw;Spoon Oral Phase Impairments: Reduced labial seal;Poor awareness of bolus Oral Phase Functional Implications: Left anterior spillage    Nectar Thick Nectar Thick Liquid: Not tested   Honey Thick Honey Thick Liquid: Not tested   Puree Puree: Within functional limits Presentation: Spoon   Solid   GO Functional Assessment Tool Used: clinical judgement Functional Limitations: Swallowing Swallow Current Status ([id number]): At least 20 percent but less than 40 percent impaired, limited or restricted Swallow Goal Status [phone]): 0 percent impaired, limited or restricted  Solid: Impaired Presentation: Spoon;Self Fed Oral Phase Impairments: Reduced labial seal;Reduced lingual movement/coordination;Poor awareness of bolus Oral Phase Functional Implications: Left anterior spillage;Oral residue       Thank you,  Havery Moros, CCC-SLP  [phone]  PORTER,DABNEY 11/29/2014,7:58 AM

## 2014-11-29 NOTE — Progress Notes (Signed)
[facility] PROGRESS NOTE  Shaheem Pichon UJW:[id number] DOB: [birth year] DOA: 11/28/2014 PCP: Toma Deiters, MD  Assessment/Plan: Acute right MCA stroke: MRI confirmed right MCA infarct. Carotid dopplers negative, echo with EF 60% no diastolic dysfunction, Hemoglobin A1c 6.2, lipid panel within the limits of normal, no events on tele. Evaluated by ST who recommended dysphagia 3, thin liquids with supervision. PT recommends HHPT.  Spoke with Dr Thad Ranger neurology who recommended coag work up as well as TEE and event monitor.   Active Problems:  Left-sided weakness: continue to improve. Evaluated by PT who recommended HHPT.    Dysphagia: evaluated by ST who recommended dysphagia 3 diet with thin liquids and 100% supervision.  Tobacco use: cessation counseling offered.   Code Status: full Family Communication: wife at bedside Disposition Plan: home when ready   Consultants:  none  Procedures:  Echo Systolic function was normal. The estimated ejection fraction was in the range of 55% to 60%. Diastolic function is abnormal, indeterminate grade. Wall motion was normal; there were no regional wall motion abnormalities.    Antibiotics:  none  HPI/Subjective: Awake alert but needs encouragement to participate in interview and exam. Reports headache but otherwise no pain  Objective: Filed Vitals:   11/29/14 1031  BP: 124/57  Pulse: 53  Temp: 98.8 F (37.1 C)  Resp: 18    Intake/Output Summary (Last 24 hours) at 11/29/14 1528 Last data filed at 11/29/14 0930  Gross per 24 hour  Intake    970 ml  Output      0 ml  Net    970 ml   Filed Weights   11/28/14 0801  Weight: 81.647 kg (180 lb)    Exam:   General:  Well nourished appears comfortable  Cardiovascular: RRR no MGR No LE edema  Respiratory: normal effort BS clear bilaterally no wheeze  Abdomen: non-distended non-tender +BS   Musculoskeletal: no clubbing no cyanosis  Neuro: alert oriented  speech slow but clear, follows commands, left grip 4/5 right grip 5/5. Left lower extremity strength 4/5 right LE strength 5/5.  Data Reviewed: Basic Metabolic Panel:  Recent Labs Lab 11/28/14 0820  NA 139  K 4.2  CL 108  CO2 21  GLUCOSE 86  BUN 9  CREATININE 0.91  CALCIUM 9.1   Liver Function Tests:  Recent Labs Lab 11/28/14 0820  AST 49*  ALT 24  ALKPHOS 78  BILITOT 0.8  PROT 7.6  ALBUMIN 4.5   No results for input(s): LIPASE, AMYLASE in the last 168 hours. No results for input(s): AMMONIA in the last 168 hours. CBC:  Recent Labs Lab 11/28/14 0820  WBC 6.7  NEUTROABS 4.4  HGB 14.3  HCT 42.4  MCV 91.0  PLT 200   Cardiac Enzymes: No results for input(s): CKTOTAL, CKMB, CKMBINDEX, TROPONINI in the last 168 hours. BNP (last 3 results) No results for input(s): BNP in the last 8760 hours.  ProBNP (last 3 results) No results for input(s): PROBNP in the last 8760 hours.  CBG: No results for input(s): GLUCAP in the last 168 hours.  No results found for this or any previous visit (from the past 240 hour(s)).   Studies: Dg Chest 1 View  11/28/2014   CLINICAL DATA:  Left-sided weakness. Left facial droop. Slurred speech and difficulty ambulating.  EXAM: CHEST  1 VIEW  COMPARISON:  None.  FINDINGS: The heart size and mediastinal contours are within normal limits. Both lungs are clear. The visualized skeletal structures are unremarkable.  IMPRESSION: No active  disease.   Electronically Signed   By: Charlett Nose M.D.   On: 11/28/2014 15:13   Ct Head Wo Contrast  11/28/2014   CLINICAL DATA:  54 year old male with left side weakness and headache for 12-24 hours. Initial encounter.  EXAM: CT HEAD WITHOUT CONTRAST  TECHNIQUE: Contiguous axial images were obtained from the base of the skull through the vertex without intravenous contrast.  COMPARISON:  [facility] head and cervical spine CT 06/18/2011.  FINDINGS: Visualized paranasal sinuses and mastoids are  clear. No acute osseous abnormality identified. Visualized orbits and scalp soft tissues are within normal limits.  Abnormal loss of gray-white matter differentiation in the right MCA territory. Insula and operculum most affected (series 2, image 17). Hyperdense right MCA visible on image 13.  ASPECTS score is 6 (insula, M2, M5, and also M4 segments are abnormal).  Minimal to mild mass effect on the right lateral ventricle. Trace if any midline shift. No acute intracranial hemorrhage identified.  No ventriculomegaly. Patchy cerebral white matter hypodensity elsewhere appears stable. Stable gray-white matter differentiation elsewhere.  IMPRESSION: 1. Acute right MCA infarct. Hyperdense right MCA bifurcation/M2 branch is visible. ASPECTS score is 6. 2. No associated hemorrhage.  Minimal to mild mass effect. 3. Study discussed by telephone with Zadie Rhine on 11/28/2014 at 0834 hours.   Electronically Signed   By: Odessa Fleming M.D.   On: 11/28/2014 08:40   Mr Maxine Glenn Head Wo Contrast  11/28/2014   CLINICAL DATA:  Left-sided facial droop. Left upper and lower extremity weakness which began this morning. Abnormal head CT.  EXAM: MRI HEAD WITHOUT CONTRAST  MRA HEAD WITHOUT CONTRAST  TECHNIQUE: Multiplanar, multiecho pulse sequences of the brain and surrounding structures were obtained without intravenous contrast. Angiographic images of the head were obtained using MRA technique without contrast.  COMPARISON:  CT same day  FINDINGS: MRI HEAD FINDINGS  There is acute infarction affecting approximately 50% of the right MCA territory including the right temporal lobe, much of the insular region, in the posterior frontal cortical and subcortical brain. Overall area of involvement measures approximately 6 cm in diameter. There is mild brain swelling but no evidence of hemorrhage or mass effect.  Elsewhere, the brain shows mild chronic small-vessel disease of the deep and subcortical white matter. No other large vessel territory  infarction. No mass lesion, hydrocephalus or extra-axial collection. No pituitary mass. No inflammatory sinus disease.  MRA HEAD FINDINGS  Both internal carotid arteries are widely patent into the brain. The anterior and middle cerebral vessels on the left are widely patent and normal. On the right, the anterior cerebral artery is widely patent and normal. There is occlusion of the right middle cerebral artery at the M1 M2 junction, consistent with embolic disease. Flow is present within an anterior temporal branch.  Both vertebral arteries are widely patent to the basilar no basilar stenosis. Posterior circulation branch vessels are normal. Large posterior communicating artery on the left.  IMPRESSION: Embolic occlusion of the right middle cerebral artery at the M1 M2 junction. Persistent flow in a small anterior temporal branch.  Acute infarction affecting approximately 50% of the right MCA territory. Mild swelling but no hemorrhage or shift.   Electronically Signed   By: Paulina Fusi M.D.   On: 11/28/2014 16:16   Mr Brain Wo Contrast  11/28/2014   CLINICAL DATA:  Left-sided facial droop. Left upper and lower extremity weakness which began this morning. Abnormal head CT.  EXAM: MRI HEAD WITHOUT CONTRAST  MRA  HEAD WITHOUT CONTRAST  TECHNIQUE: Multiplanar, multiecho pulse sequences of the brain and surrounding structures were obtained without intravenous contrast. Angiographic images of the head were obtained using MRA technique without contrast.  COMPARISON:  CT same day  FINDINGS: MRI HEAD FINDINGS  There is acute infarction affecting approximately 50% of the right MCA territory including the right temporal lobe, much of the insular region, in the posterior frontal cortical and subcortical brain. Overall area of involvement measures approximately 6 cm in diameter. There is mild brain swelling but no evidence of hemorrhage or mass effect.  Elsewhere, the brain shows mild chronic small-vessel disease of the deep  and subcortical white matter. No other large vessel territory infarction. No mass lesion, hydrocephalus or extra-axial collection. No pituitary mass. No inflammatory sinus disease.  MRA HEAD FINDINGS  Both internal carotid arteries are widely patent into the brain. The anterior and middle cerebral vessels on the left are widely patent and normal. On the right, the anterior cerebral artery is widely patent and normal. There is occlusion of the right middle cerebral artery at the M1 M2 junction, consistent with embolic disease. Flow is present within an anterior temporal branch.  Both vertebral arteries are widely patent to the basilar no basilar stenosis. Posterior circulation branch vessels are normal. Large posterior communicating artery on the left.  IMPRESSION: Embolic occlusion of the right middle cerebral artery at the M1 M2 junction. Persistent flow in a small anterior temporal branch.  Acute infarction affecting approximately 50% of the right MCA territory. Mild swelling but no hemorrhage or shift.   Electronically Signed   By: Paulina Fusi M.D.   On: 11/28/2014 16:16   US Carotid Bilateral  11/28/2014   CLINICAL DATA:  Right MCA acute infarction  EXAM: BILATERAL CAROTID DUPLEX ULTRASOUND  TECHNIQUE: Wallace Cullens scale imaging, color Doppler and duplex ultrasound were performed of bilateral carotid and vertebral arteries in the neck.  COMPARISON:  11/28/2014  FINDINGS: Criteria: Quantification of carotid stenosis is based on velocity parameters that correlate the residual internal carotid diameter with NASCET-based stenosis levels, using the diameter of the distal internal carotid lumen as the denominator for stenosis measurement.  The following velocity measurements were obtained:  RIGHT  ICA:  81/19 cm/sec  CCA:  145/17 cm/sec  SYSTOLIC ICA/CCA RATIO:  0.7  DIASTOLIC ICA/CCA RATIO:  1.1  ECA:  107 cm/sec  LEFT  ICA:  117/40 cm/sec  CCA:  137/22 cm/sec  SYSTOLIC ICA/CCA RATIO:  0.9  DIASTOLIC ICA/CCA RATIO:  1.8   ECA:  111 cm/sec  RIGHT CAROTID ARTERY: Minor echogenic shadowing plaque formation. No hemodynamically significant right ICA stenosis, velocity elevation, or turbulent flow. Degree of narrowing less than 50%.  RIGHT VERTEBRAL ARTERY:  Antegrade  LEFT CAROTID ARTERY: Similar scattered minor echogenic plaque formation. No hemodynamically significant left ICA stenosis, velocity elevation, or turbulent flow.  LEFT VERTEBRAL ARTERY:  Antegrade  IMPRESSION: Minor carotid atherosclerosis. No hemodynamically significant ICA stenosis. Degree of narrowing less than 50% bilaterally.   Electronically Signed   By: Judie Petit.  Shick M.D.   On: 11/28/2014 17:04    Scheduled Meds: . LORazepam  1 mg Intravenous Once  . simvastatin  20 mg Oral q1800   Continuous Infusions: . sodium chloride 50 mL/hr at 11/28/14 1301    Principal Problem:   Acute right MCA stroke Active Problems:   Stroke   Left-sided weakness   Dysphagia   Tobacco abuse    Time spent: 35 minutes    [facility] M  [facility] Pager [phone]. If 7PM-7AM, please contact night-coverage at [email or web address], password [email or web address] 11/29/2014, 3:28 PM  LOS: 1 day

## 2014-11-29 NOTE — Evaluation (Signed)
Physical Therapy Evaluation Patient Details Name: Fred Bell MRN: [id number] DOB: 05/06/1961 Today's Date: 11/29/2014   History of Present Illness  53yom presented with left facial weakness, LUE, LLE weakness onset time unknown. Discovered this AM. Last seen normal 6 PM 3/30. CT revealted acute right MCA infarct with minimal to mild mass effect right lateral ventrile and trace if any midline shift. Serum alcohol elevated. Uses THC, denies other drugs.  Pt lives with his wife and was employed in heating and Engineer, materialsair conditioning repair.    Clinical Impression  Pt was seen for evaluation and was found to be severely lethargic, difficult to keep him awake.  He was able to follow about 50% of directions.  He did have antigravity strength of the LLE but coordination is decreased.  He also appears to have some neglect of the Left side.  His sitting balance is only fair, standing balance is poor.  He is not able to functionally ambulate even with a walker:  He leans to the right, has difficulty advancing the LLE and cannot grip the walker with his left hand (He is not yet awake enough to try a hemi walker or cane).  Wife is very adamant about taking pt home at d/c so we will recommend HHPT and HHOT.  He will need transport home via ambulance due to tenuous sitting balance and lethargy.    Follow Up Recommendations Home health PT    Equipment Recommendations  Wheelchair (measurements PT);3in1 (PT)    Recommendations for Other Services OT consult     Precautions / Restrictions Precautions Precautions: Fall Restrictions Weight Bearing Restrictions: No      Mobility  Bed Mobility Overal bed mobility: Needs Assistance Bed Mobility: Supine to Sit     Supine to sit: Supervision        Transfers Overall transfer level: Needs assistance Equipment used: None Transfers: Sit to/from Stand Sit to Stand: Min assist         General transfer comment: standing balance is poor and he needs  stabilization to stand at edge of bed  Ambulation/Gait Ambulation/Gait assistance: Max assist Ambulation Distance (Feet): 15 Feet Assistive device: Rolling walker (2 wheeled) Gait Pattern/deviations: Decreased step length - left;Staggering right;Decreased weight shift to left;Decreased stance time - left;Decreased dorsiflexion - left Gait velocity: pt tires to ambulate too quickly for the severity of his dysfunction Gait velocity interpretation: at or above normal speed for age/gender General Gait Details: very unstable gait pattern  Stairs            Wheelchair Mobility    Modified Rankin (Stroke Patients Only)       Balance Overall balance assessment: Needs assistance Sitting-balance support: No upper extremity supported;Feet supported Sitting balance-Leahy Scale: Fair Sitting balance - Comments: pt unable to tolerate any challenge to balance   Standing balance support: No upper extremity supported Standing balance-Leahy Scale: Poor                               Pertinent Vitals/Pain Pain Assessment: No/denies pain    Home Living Family/patient expects to be discharged to:: Private residence (wife refuses to consider SNF) Living Arrangements: Spouse/significant other;Children Available Help at Discharge: Family;Available 24 hours/day Type of Home: House Home Access: Stairs to enter Entrance Stairs-Rails: None Entrance Stairs-Number of Steps: 1 Home Layout: One level Home Equipment: None      Prior Function Level of Independence: Independent  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     Hand Dominance   Dominant Hand: Right    Extremity/Trunk Assessment   Upper Extremity Assessment: Defer to OT evaluation           Lower Extremity Assessment: LLE deficits/detail   LLE Deficits / Details: pt is able to move all major muscle groups against gravity but coordination and awareness of LLE is decreased which severely  affects function     Communication   Communication: No difficulties (pt is very lethargic and it is difficult to assess..he does follow some directions correctly)  Cognition Arousal/Alertness: Lethargic Behavior During Therapy: Flat affect Overall Cognitive Status:  (unable to determine due to lethargy)                      General Comments      Exercises        Assessment/Plan    PT Assessment Patient needs continued PT services  PT Diagnosis Difficulty walking;Abnormality of gait;Generalized weakness;Hemiplegia non-dominant side   PT Problem List Decreased strength;Decreased activity tolerance;Decreased balance;Decreased mobility;Decreased coordination;Decreased cognition;Decreased knowledge of use of DME;Decreased safety awareness  PT Treatment Interventions Neuromuscular re-education;Therapeutic exercise;Functional mobility training;Gait training   PT Goals (Current goals can be found in the Care Plan section) Acute Rehab PT Goals Patient Stated Goal: none stated PT Goal Formulation: With family Time For Goal Achievement: 12/13/14 Potential to Achieve Goals: Good    Frequency Min 6X/week   Barriers to discharge   none    Co-evaluation               End of Session Equipment Utilized During Treatment: Gait belt Activity Tolerance: Patient limited by lethargy Patient left: in bed;with call bell/phone within reach;with bed alarm set Nurse Communication: Mobility status         Time: 1101-1135 PT Time Calculation (min) (ACUTE ONLY): 34 min   Charges:   PT Evaluation $Initial PT Evaluation Tier I: 1 Procedure     PT G CodesMyrlene Broker L 11/29/2014, 11:58 AM

## 2014-11-29 NOTE — Progress Notes (Signed)
Pt's wife requested that he have something to help him rest. MD paged and orders given. Informed wife of orders. Wife stated that pt was resting better at this time and did not want the medication. Asked that wife notify nursing staff if she felt that pt need the medication for rest.

## 2014-11-29 NOTE — Progress Notes (Signed)
UR completed 

## 2014-11-29 NOTE — Progress Notes (Signed)
OT Cancellation Note  Patient Details Name: Fred Bell MRN: [id number] DOB: 12/02/1960   Cancelled Treatment:     Reason evaluation not completed: Pt very fatigued and lethargic, unable to remain awake/alert longer than approximately 30 seconds to participate in evaluation. Pt wife reports pt is using LUE to pull up in bed, roll over, and his ability to use his LUE  has improved since yesterday.  Ezra SitesLeslie Razia Screws, OTR/L  [phone]  11/29/2014, 9:05 AM

## 2014-11-29 NOTE — Care Management Note (Signed)
    Page 1 of 2   11/29/2014     1:47:02 PM CARE MANAGEMENT NOTE 11/29/2014  Patient:  Fred Bell,Fred Bell   Account Number:  [id number]  Date Initiated:  11/29/2014  Documentation initiated by:  Sharrie RothmanBLACKWELL,Rees Matura C  Subjective/Objective Assessment:   Pt admitted from home with CVA. Pt lives with his wife and 5 children. Pt had been independent with ADL's prior to stroke. Pt has no insurance.     Action/Plan:   Financial counselor is aware of self pay status. PT/ST is recommending HH services at discharge along with wheelchair and BSC. DME will be delivered to pts room prior to discharge.   Anticipated DC Date:  11/30/2014   Anticipated DC Plan:  HOME W HOME HEALTH SERVICES      DC Planning Services  CM consult      PAC Choice  DURABLE MEDICAL EQUIPMENT  HOME HEALTH   Choice offered to / List presented to:  C-1 Patient   DME arranged  BEDSIDE COMMODE  WHEELCHAIR - MANUAL      DME agency  [facility]     HH arranged  HH-1 RN  HH-2 PT  HH-3 OT  HH-5 SPEECH THERAPY      HH agency  [facility]   Status of service:  Completed, signed off Medicare Important Message given?   (If response is "NO", the following Medicare IM given date fields will be blank) Date Medicare IM given:   Medicare IM given by:   Date Additional Medicare IM given:   Additional Medicare IM given by:    Discharge Disposition:  HOME W HOME HEALTH SERVICES  Per UR Regulation:    If discussed at Long Length of Stay Meetings, dates discussed:    Comments:  11/29/14 1345 Arlyss Queenammy Hope Brandenburger, RN BSN CM Pt HH and DME will be provided by [facility]. HH services to start within 48 hours of discharge. No MATCH voucher is needed. Pt and pts nurse aware of discharge arrangements.

## 2014-11-29 NOTE — Progress Notes (Signed)
  Echocardiogram 2D Echocardiogram has been performed.  Stacey DrainWhite, Roshad Hack J 11/29/2014, 9:52 AM

## 2014-11-30 DIAGNOSIS — M6289 Other specified disorders of muscle: Secondary | ICD-10-CM

## 2014-11-30 DIAGNOSIS — Z720 Tobacco use: Secondary | ICD-10-CM

## 2014-11-30 DIAGNOSIS — R1310 Dysphagia, unspecified: Secondary | ICD-10-CM

## 2014-11-30 DIAGNOSIS — I63511 Cerebral infarction due to unspecified occlusion or stenosis of right middle cerebral artery: Principal | ICD-10-CM

## 2014-11-30 LAB — SEDIMENTATION RATE: Sed Rate: 8 mm/hr (ref 0–16)

## 2014-11-30 LAB — ANTITHROMBIN III: ANTITHROMB III FUNC: 100 % (ref 75–120)

## 2014-11-30 MED ORDER — LOVASTATIN 20 MG PO TABS: 20.0000 mg | ORAL_TABLET | Freq: Every day | ORAL | Status: DC

## 2014-11-30 MED ORDER — HYDROCODONE-ACETAMINOPHEN 5-325 MG PO TABS
1.0000 | ORAL_TABLET | Freq: Four times a day (QID) | ORAL | Status: DC | PRN
  Administered 2014-11-30: 1 via ORAL
  Filled 2014-11-30: qty 1

## 2014-11-30 MED ORDER — ASPIRIN 81 MG PO TABS: 81.0000 mg | ORAL_TABLET | Freq: Every day | ORAL | Status: AC

## 2014-11-30 MED ORDER — HYDROCODONE-ACETAMINOPHEN 5-325 MG PO TABS: 1.0000 | ORAL_TABLET | Freq: Four times a day (QID) | ORAL | Status: DC | PRN

## 2014-11-30 NOTE — Discharge Summary (Signed)
Physician Discharge Summary  Fred Bell ZOX:[id number] DOB: [birth year] DOA: 11/28/2014  PCP: Fred Deiters, MD  Admit date: 11/28/2014 Discharge date: 11/30/2014  Recommendations for Outpatient Follow-up:  1. Acute stroke, see discussion below. Hypercoagulability panel pending. Plans for outpatient transesophageal echocardiogram and event monitor to further evaluate for embolic stroke. 2. Continued encouragement of abscess or of alcohol, tobacco and THC. 3. Home health physical, occupational and speech therapy. Patient refused skilled nursing facility placement.   Follow-up Information    Follow up with [facility].   Contact information:   [address] [place] [address] [phone]       Follow up with [facility], MD In 2 weeks.   Specialty:  Internal Medicine   Contact information:   [address] [place] [address] [phone]       Follow up with [facility], KOFI, MD. Schedule an appointment as soon as possible for a visit in 1 month.   Specialty:  Neurology   Contact information:   [address] [place] [address] [phone]      Discharge Diagnoses:  1. 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 Alcohol abuse 3. Marijuana use 4. Tobacco dependence  Discharge Condition: improved Disposition: home with HH PT, OT, ST  Diet recommendation: D3/mech soft and thin liquids with 100% supervision.  Filed Weights   11/28/14 0801  Weight: 81.647 kg (180 lb)    History of present illness:  53yom presented with left facial weakness, LUE, LLE weakness onset time unknown. Last seen normal >12 hours prior to presentation. CT revealed acute right MCA infarct with minimal to mild mass effect right lateral ventrile and trace if any midline shift. Serum alcohol elevated.  Hospital Course:  [name] presented more than 12 hours after the beginning of  symptoms and therefore was not a candidate for intervention per discussion with neurology as documented elsewhere. Further stroke workup confirmed a large right MCA infarct suspected embolic in origin. Telemetry showed sinus bradycardia, transthoracic echocardiogram was unrevealing. Symptomatically the patient improved with increased upper extremity and left lower extremity strength. Skilled nursing facility rehabilitation was recommended however the patient and wife refused. At time of discharge his strength was improving and he was tolerating a diet. After further discussion with neurology, hypercoagulability panel was ordered and plans were made for outpatient transesophageal echocardiogram as well as 30 day event monitor to assess for occult arrhythmia. Recommendations were discussed in detail with wife and patient at bedside. Individual issues as below. They were counseled to return for new weakness, confusion, lethargy or worsening of condition.  1. 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 Not previously on ASA. LDL 81. TTE, carotids unremarkable. Telemetry SB/SR.  2. Alcohol abuse 3. THC abuse 4. Tobacco dependence   Hypercoagulable workup in process. Plan for outpatient TEE and 30 day event monitor. In meantime, recommendations for ASA, statin.  Follow-up with neurology one month.  SNF recommended but patient and wife refuse. Plan HHPT, HHOT, HHST.  Recommend abstinence from alcohol, THC and cigarettes  Discharge Instructions  Discharge Instructions    Diet - low sodium heart healthy    Complete by:  As directed      Discharge instructions    Complete by:  As directed   Call your physician or seek immediate medical attention for new or worse weakness, difficulty speaking, swallowing, pain, lethargy, confusion  or worsening of condition.     Increase activity slowly    Complete by:  As directed           Current Discharge  Medication List    START taking these medications   Details  aspirin 81 MG tablet Take 1 tablet (81 mg total) by mouth daily.    HYDROcodone-acetaminophen (NORCO/VICODIN) 5-325 MG per tablet Take 1 tablet by mouth every 6 (six) hours as needed for moderate pain. Qty: 20 tablet, Refills: 0    lovastatin (MEVACOR) 20 MG tablet Take 1 tablet (20 mg total) by mouth at bedtime. Qty: 30 tablet, Refills: 0      STOP taking these medications     traMADol (ULTRAM) 50 MG tablet        No Known Allergies  The results of significant diagnostics from this hospitalization (including imaging, microbiology, ancillary and laboratory) are listed below for reference.    Significant Diagnostic Studies: Dg Chest 1 View  11/28/2014   CLINICAL DATA:  Left-sided weakness. Left facial droop. Slurred speech and difficulty ambulating.  EXAM: CHEST  1 VIEW  COMPARISON:  None.  FINDINGS: The heart size and mediastinal contours are within normal limits. Both lungs are clear. The visualized skeletal structures are unremarkable.  IMPRESSION: No active disease.   Electronically Signed   By: Fred NoseKevin  Bell M.D.   On: 11/28/2014 15:13   Ct Head Wo Contrast  11/28/2014   CLINICAL DATA:  54 year old male with left side weakness and headache for 12-24 hours. Initial encounter.  EXAM: CT HEAD WITHOUT CONTRAST  TECHNIQUE: Contiguous axial images were obtained from the base of the skull through the vertex without intravenous contrast.  [facility]18/[facility].  FINDINGS: Visualized paranasal sinuses and mastoids are clear. No acute osseous abnormality identified. Visualized orbits and scalp soft tissues are within normal limits.  Abnormal loss of gray-white matter differentiation in the right MCA territory. Insula and operculum most affected (series 2, image 17). Hyperdense right MCA visible on image 13.  ASPECTS score is 6 (insula, M2, M5, and also M4 segments are abnormal).   Minimal to mild mass effect on the right lateral ventricle. Trace if any midline shift. No acute intracranial hemorrhage identified.  No ventriculomegaly. Patchy cerebral white matter hypodensity elsewhere appears stable. Stable gray-white matter differentiation elsewhere.  IMPRESSION: 1. Acute right MCA infarct. Hyperdense right MCA bifurcation/M2 branch is visible. ASPECTS score is 6. 2. No associated hemorrhage.  Minimal to mild mass effect. 3. Study discussed by telephone with Zadie RhineNALD WICKLINE on 11/28/2014 at 0834 hours.   Electronically Signed   By: Odessa FlemingH  Hall M.D.   On: 11/28/2014 08:40   Mr Maxine GlennMra Head Wo Contrast  11/28/2014   CLINICAL DATA:  Left-sided facial droop. Left upper and lower extremity weakness which began this morning. Abnormal head CT.  EXAM: MRI HEAD WITHOUT CONTRAST  MRA HEAD WITHOUT CONTRAST  TECHNIQUE: Multiplanar, multiecho pulse sequences of the brain and surrounding structures were obtained without intravenous contrast. Angiographic images of the head were obtained using MRA technique without contrast.  COMPARISON:  CT same day  FINDINGS: MRI HEAD FINDINGS  There is acute infarction affecting approximately 50% of the right MCA territory including the right temporal lobe, much of the insular region, in the posterior frontal cortical and subcortical brain. Overall area of involvement measures approximately 6 cm in diameter. There is mild brain swelling but no evidence of hemorrhage or mass effect.  Elsewhere, the brain  shows mild chronic small-vessel disease of the deep and subcortical white matter. No other large vessel territory infarction. No mass lesion, hydrocephalus or extra-axial collection. No pituitary mass. No inflammatory sinus disease.  MRA HEAD FINDINGS  Both internal carotid arteries are widely patent into the brain. The anterior and middle cerebral vessels on the left are widely patent and normal. On the right, the anterior cerebral artery is widely patent and normal. There is  occlusion of the right middle cerebral artery at the M1 M2 junction, consistent with embolic disease. Flow is present within an anterior temporal branch.  Both vertebral arteries are widely patent to the basilar no basilar stenosis. Posterior circulation branch vessels are normal. Large posterior communicating artery on the left.  IMPRESSION: Embolic occlusion of the right middle cerebral artery at the M1 M2 junction. Persistent flow in a small anterior temporal branch.  Acute infarction affecting approximately 50% of the right MCA territory. Mild swelling but no hemorrhage or shift.   Electronically Signed   By: Paulina Fusi M.D.   On: 11/28/2014 16:16   Mr Brain Wo Contrast  11/28/2014   CLINICAL DATA:  Left-sided facial droop. Left upper and lower extremity weakness which began this morning. Abnormal head CT.  EXAM: MRI HEAD WITHOUT CONTRAST  MRA HEAD WITHOUT CONTRAST  TECHNIQUE: Multiplanar, multiecho pulse sequences of the brain and surrounding structures were obtained without intravenous contrast. Angiographic images of the head were obtained using MRA technique without contrast.  COMPARISON:  CT same day  FINDINGS: MRI HEAD FINDINGS  There is acute infarction affecting approximately 50% of the right MCA territory including the right temporal lobe, much of the insular region, in the posterior frontal cortical and subcortical brain. Overall area of involvement measures approximately 6 cm in diameter. There is mild brain swelling but no evidence of hemorrhage or mass effect.  Elsewhere, the brain shows mild chronic small-vessel disease of the deep and subcortical white matter. No other large vessel territory infarction. No mass lesion, hydrocephalus or extra-axial collection. No pituitary mass. No inflammatory sinus disease.  MRA HEAD FINDINGS  Both internal carotid arteries are widely patent into the brain. The anterior and middle cerebral vessels on the left are widely patent and normal. On the right, the  anterior cerebral artery is widely patent and normal. There is occlusion of the right middle cerebral artery at the M1 M2 junction, consistent with embolic disease. Flow is present within an anterior temporal branch.  Both vertebral arteries are widely patent to the basilar no basilar stenosis. Posterior circulation branch vessels are normal. Large posterior communicating artery on the left.  IMPRESSION: Embolic occlusion of the right middle cerebral artery at the M1 M2 junction. Persistent flow in a small anterior temporal branch.  Acute infarction affecting approximately 50% of the right MCA territory. Mild swelling but no hemorrhage or shift.   Electronically Signed   By: Paulina Fusi M.D.   On: 11/28/2014 16:16   US Carotid Bilateral  11/28/2014   CLINICAL DATA:  Right MCA acute infarction  EXAM: BILATERAL CAROTID DUPLEX ULTRASOUND  TECHNIQUE: Wallace Cullens scale imaging, color Doppler and duplex ultrasound were performed of bilateral carotid and vertebral arteries in the neck.  COMPARISON:  11/28/2014  FINDINGS: Criteria: Quantification of carotid stenosis is based on velocity parameters that correlate the residual internal carotid diameter with NASCET-based stenosis levels, using the diameter of the distal internal carotid lumen as the denominator for stenosis measurement.  The following velocity measurements were obtained:  RIGHT  ICA:  81/19 cm/sec  CCA:  145/17 cm/sec  SYSTOLIC ICA/CCA RATIO:  0.7  DIASTOLIC ICA/CCA RATIO:  1.1  ECA:  107 cm/sec  LEFT  ICA:  117/40 cm/sec  CCA:  137/22 cm/sec  SYSTOLIC ICA/CCA RATIO:  0.9  DIASTOLIC ICA/CCA RATIO:  1.8  ECA:  111 cm/sec  RIGHT CAROTID ARTERY: Minor echogenic shadowing plaque formation. No hemodynamically significant right ICA stenosis, velocity elevation, or turbulent flow. Degree of narrowing less than 50%.  RIGHT VERTEBRAL ARTERY:  Antegrade  LEFT CAROTID ARTERY: Similar scattered minor echogenic plaque formation. No hemodynamically significant left ICA  stenosis, velocity elevation, or turbulent flow.  LEFT VERTEBRAL ARTERY:  Antegrade  IMPRESSION: Minor carotid atherosclerosis. No hemodynamically significant ICA stenosis. Degree of narrowing less than 50% bilaterally.   Electronically Signed   By: Judie Petit.  Shick M.D.   On: 11/28/2014 17:04    Labs: Basic Metabolic Panel:  Recent Labs Lab 11/28/14 0820  NA 139  K 4.2  CL 108  CO2 21  GLUCOSE 86  BUN 9  CREATININE 0.91  CALCIUM 9.1   Liver Function Tests:  Recent Labs Lab 11/28/14 0820  AST 49*  ALT 24  ALKPHOS 78  BILITOT 0.8  PROT 7.6  ALBUMIN 4.5   CBC:  Recent Labs Lab 11/28/14 0820  WBC 6.7  NEUTROABS 4.4  HGB 14.3  HCT 42.4  MCV 91.0  PLT 200    Principal Problem:   Acute right MCA stroke Active Problems:   Stroke   Left-sided weakness   Dysphagia   Tobacco abuse   Time coordinating discharge: 35 minutes  Signed:  Brendia Sacks, MD [facility] 11/30/2014, 11:21 AM

## 2014-11-30 NOTE — Progress Notes (Signed)
Discharge instructions and prescriptions given to patient's wife who verbalized understanding, out in stable condition via w/c with staff.

## 2014-11-30 NOTE — Progress Notes (Signed)
Pt is complaining of a headache and that the tylenol is not helping. He would like something stronger.  I have paged Dr.Le and there are no new orders at this time. I will continue to monitor

## 2014-11-30 NOTE — Progress Notes (Signed)
PROGRESS NOTE  Fred Bell [id number]:[id number] DOB: [birth year] DOA: 11/28/2014 PCP: Neale Burly, MD  Summary: 75yom presented with left facial weakness, LUE, LLE weakness onset time unknown. Last seen normal >12 hours prior to presentation. CT revealed acute right MCA infarct with minimal to mild mass effect right lateral ventrile and trace if any midline shift. Serum alcohol elevated. MRI/A confirmed substantial right MCA infarct with occulsion of right MCA; mild swelling, no hemorrhage or shift. Not a candidate for intervention per Dr. Doy Mince neurology on call at time of admission because of delay in presentation, last seen normal >12 hours. Hypercoagulable workup in process. Plan for outpatient TEE and 30 day event monitor. In meantime, recommendations for ASA, statin.  Assessment/Plan: 1. 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 Not previously on ASA. LDL 81. TTE, carotids unremarkable. Telemetry SB/SR. Continues to improve. 2. Alcohol abuse 3. THC abuse 4. Tobacco dependence   Home today.   Hypercoagulable workup in process. Plan for outpatient TEE and 30 day event monitor. In meantime, recommendations for ASA, statin.  Follow-up with neurology one month.  SNF recommended but patient and wife refuse. Plan HHPT, HHOT, HHST, CSW.  Recommend abstinence from alcohol, THC and cigarettes  Discussed with wife at bedside.   Murray Hodgkins, MD  [facility]  Pager [phone] If 7PM-7AM, please contact night-coverage at [email or web address], password [facility] 11/30/2014, 9:09 AM  LOS: 2 days   Consultants:  Neurology by telephone (no neurology coverage currently available at [facility])  PT: SNF  OT  ST: Recommend D3/mech soft and thin liquids with 100% supervision. If pt cannot be supervised, he will need to be changed to puree and nectar thick liquids. Also recommend outpatient ST (cognition) to maximize safety and independance and decrease  burden of care.  Procedures:  2-d echocardiogram Study Conclusions  - Left ventricle: The cavity size was normal. Wall thickness was normal. Systolic function was normal. The estimated ejection fraction was in the range of 55% to 60%. Diastolic function is abnormal, indeterminate grade. Wall motion was normal; there were no regional wall motion abnormalities. - Aortic valve: Valve area (VTI): 2.94 cm^2. Valve area (Vmax): 2.67 cm^2. - Atrial septum: No defect or patent foramen ovale was identified. - Technically adequate study.  Antibiotics:    HPI/Subjective: Reported headache last night. No neuro changes reported.  Some headache but feeling better otherwise, strength improving, no neuro changes. "I'm ready to go home".  Objective: Filed Vitals:   11/29/14 1430 11/29/14 1841 11/29/14 2222 11/30/14 0612  BP: 126/67 115/56 119/74 132/72  Pulse: 55 54 50 52  Temp: 98.5 F (36.9 C) 98.2 F (36.8 C) 97.5 F (36.4 C) 98.3 F (36.8 C)  TempSrc: Axillary Axillary Axillary Axillary  Resp: 18 18 16 15   Height:      Weight:      SpO2: 100% 98% 98% 97%    Intake/Output Summary (Last 24 hours) at 11/30/14 0909 Last data filed at 11/29/14 2015  Gross per 24 hour  Intake    360 ml  Output   1050 ml  Net   -690 ml     Filed Weights   11/28/14 0801  Weight: 81.647 kg (180 lb)    Exam:     Afebrile, VSS, no hypoxia General:  Appears calm and comfortable, alert. Eyes: PERRL, normal lids, irises. EOMI intact. ENT: grossly normal hearing, lips   Cardiovascular: RRR, no m/r/g. No LE edema. Telemetry: SR/SB, no arrhythmias  Respiratory: CTA bilaterally,  no w/r/r. Normal respiratory effort. Musculoskeletal: grossly normal tone BUE/BLE. Moves all extremities well. Improved strength LUE, uses to pull up in bed. Lifts left leg off bed. Mild left facial weakness stable. Easily lifts left leg off bed. Psychiatric: grossly normal mood and affect, speech fluent and  appropriate Neurologic: as above, improved. CN otherwise stable  New data reviewed:  ESR 8  Pertinent data since admission:  CMP, troponin, CBC, INR, aPTT unremarkable  LDL 81  Hgb A1c 6.2  Serum alcohol 18 on admission  UDS positive for THC  MRI/A brain/head IMPRESSION: Embolic occlusion of the right middle cerebral artery at the M1 M2 junction. Persistent flow in a small anterior temporal branch.  Acute infarction affecting approximately 50% of the right MCA territory. Mild swelling but no hemorrhage or shift.  Carotid u/s bilateral unremarkable  EKG SR  TT Echocardiogram unremarkable  Pending data:  Homocysteine, antiphospholipid antibody, cardiolipin antibody, protein S, protein C  Scheduled Meds: . aspirin  325 mg Oral Daily  . enoxaparin (LOVENOX) injection  30 mg Subcutaneous Q24H  . LORazepam  1 mg Intravenous Once  . simvastatin  20 mg Oral q1800   Continuous Infusions:   Principal Problem:   Acute right MCA stroke Active Problems:   Stroke   Left-sided weakness   Dysphagia   Tobacco abuse

## 2014-12-01 LAB — HOMOCYSTEINE: Homocysteine: 8.6 umol/L (ref 0.0–15.0)

## 2014-12-03 ENCOUNTER — Other Ambulatory Visit: Payer: Self-pay | Admitting: Cardiovascular Disease

## 2014-12-03 DIAGNOSIS — I639 Cerebral infarction, unspecified: Secondary | ICD-10-CM

## 2014-12-04 ENCOUNTER — Ambulatory Visit (HOSPITAL_COMMUNITY)
Admission: RE | Admit: 2014-12-04 | Discharge: 2014-12-04 | Disposition: A | Discharge status: Home / Self Care | Payer: Medicaid Other | Source: Ambulatory Visit | Attending: Cardiovascular Disease | Admitting: Cardiovascular Disease

## 2014-12-04 ENCOUNTER — Encounter (HOSPITAL_COMMUNITY)
Admission: RE | Disposition: A | Discharge status: Home / Self Care | Payer: Self-pay | Source: Ambulatory Visit | Attending: Cardiovascular Disease

## 2014-12-04 ENCOUNTER — Other Ambulatory Visit: Payer: Self-pay

## 2014-12-04 ENCOUNTER — Encounter: Payer: Self-pay | Admitting: *Deleted

## 2014-12-04 ENCOUNTER — Encounter (HOSPITAL_COMMUNITY): Payer: Self-pay | Admitting: *Deleted

## 2014-12-04 ENCOUNTER — Ambulatory Visit (HOSPITAL_COMMUNITY): Payer: Self-pay | Attending: Cardiovascular Disease

## 2014-12-04 DIAGNOSIS — G464 Cerebellar stroke syndrome: Secondary | ICD-10-CM | POA: Diagnosis not present

## 2014-12-04 DIAGNOSIS — I340 Nonrheumatic mitral (valve) insufficiency: Secondary | ICD-10-CM | POA: Insufficient documentation

## 2014-12-04 DIAGNOSIS — F1010 Alcohol abuse, uncomplicated: Secondary | ICD-10-CM | POA: Diagnosis not present

## 2014-12-04 DIAGNOSIS — I071 Rheumatic tricuspid insufficiency: Secondary | ICD-10-CM | POA: Insufficient documentation

## 2014-12-04 DIAGNOSIS — I058 Other rheumatic mitral valve diseases: Secondary | ICD-10-CM | POA: Diagnosis not present

## 2014-12-04 DIAGNOSIS — I639 Cerebral infarction, unspecified: Secondary | ICD-10-CM

## 2014-12-04 DIAGNOSIS — F17210 Nicotine dependence, cigarettes, uncomplicated: Secondary | ICD-10-CM | POA: Insufficient documentation

## 2014-12-04 DIAGNOSIS — I6350 Cerebral infarction due to unspecified occlusion or stenosis of unspecified cerebral artery: Secondary | ICD-10-CM

## 2014-12-04 HISTORY — PX: TEE WITHOUT CARDIOVERSION: SHX5443

## 2014-12-04 HISTORY — DX: Cerebral infarction, unspecified: I63.9

## 2014-12-04 LAB — PROTEIN C, TOTAL: Protein C, Total: 91 % (ref 70–140)

## 2014-12-04 LAB — PROTEIN S, TOTAL: Protein S Ag, Total: 115 % (ref 58–150)

## 2014-12-04 LAB — CARDIOLIPIN ANTIBODY: PHOSPHOLIPIDS: 159 mg/dL (ref 151–264)

## 2014-12-04 SURGERY — ECHOCARDIOGRAM, TRANSESOPHAGEAL
Anesthesia: Monitor Anesthesia Care

## 2014-12-04 MED ORDER — MIDAZOLAM HCL 5 MG/5ML IJ SOLN
INTRAMUSCULAR | Status: DC | PRN
  Administered 2014-12-04 (×2): 2 mg via INTRAVENOUS

## 2014-12-04 MED ORDER — SODIUM CHLORIDE 0.9 % IV SOLN
INTRAVENOUS | Status: DC
Start: 2014-12-04 — End: 2014-12-06
  Administered 2014-12-04: 1000 mL via INTRAVENOUS

## 2014-12-04 MED ORDER — BUTAMBEN-TETRACAINE-BENZOCAINE 2-2-14 % EX AERO
INHALATION_SPRAY | CUTANEOUS | Status: DC | PRN
  Administered 2014-12-04: 2 via TOPICAL

## 2014-12-04 MED ORDER — MIDAZOLAM HCL 5 MG/5ML IJ SOLN
INTRAMUSCULAR | Status: AC
  Filled 2014-12-04: qty 10

## 2014-12-04 MED ORDER — FENTANYL CITRATE 0.05 MG/ML IJ SOLN
INTRAMUSCULAR | Status: AC
  Filled 2014-12-04: qty 4

## 2014-12-04 MED ORDER — LIDOCAINE VISCOUS 2 % MT SOLN
OROMUCOSAL | Status: AC
  Filled 2014-12-04: qty 15

## 2014-12-04 MED ORDER — FENTANYL CITRATE 0.05 MG/ML IJ SOLN
INTRAMUSCULAR | Status: DC | PRN
  Administered 2014-12-04: 50 ug via INTRAVENOUS
  Administered 2014-12-04: 25 ug via INTRAVENOUS

## 2014-12-04 NOTE — H&P (View-Only) (Signed)
PROGRESS NOTE  Fred Bell WPV:[id number] DOB: Jan 21, 1961 DOA: 11/28/2014 PCP: Neale Burly, MD  Summary: 32yom presented with left facial weakness, LUE, LLE weakness onset time unknown. Last seen normal >12 hours prior to presentation. CT revealed acute right MCA infarct with minimal to mild mass effect right lateral ventrile and trace if any midline shift. Serum alcohol elevated. MRI/A confirmed substantial right MCA infarct with occulsion of right MCA; mild swelling, no hemorrhage or shift. Not a candidate for intervention per Dr. Doy Mince neurology on call at time of admission because of delay in presentation, last seen normal >12 hours. Hypercoagulable workup in process. Plan for outpatient TEE and 30 day event monitor. In meantime, recommendations for ASA, statin.  Assessment/Plan: 1. 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 Not previously on ASA. LDL 81. TTE, carotids unremarkable. Telemetry SB/SR. Continues to improve. 2. Alcohol abuse 3. THC abuse 4. Tobacco dependence   Home today.   Hypercoagulable workup in process. Plan for outpatient TEE and 30 day event monitor. In meantime, recommendations for ASA, statin.  Follow-up with neurology one month.  SNF recommended but patient and wife refuse. Plan HHPT, HHOT, HHST, CSW.  Recommend abstinence from alcohol, THC and cigarettes  Discussed with wife at bedside.   Murray Hodgkins, MD  [facility]  Pager [phone] If 7PM-7AM, please contact night-coverage at [email or web address], password [facility] 11/30/2014, 9:09 AM  LOS: 2 days   Consultants:  Neurology by telephone (no neurology coverage currently available at [facility])  PT: SNF  OT  ST: Recommend D3/mech soft and thin liquids with 100% supervision. If pt cannot be supervised, he will need to be changed to puree and nectar thick liquids. Also recommend outpatient ST (cognition) to maximize safety and independance and decrease  burden of care.  Procedures:  2-d echocardiogram Study Conclusions  - Left ventricle: The cavity size was normal. Wall thickness was normal. Systolic function was normal. The estimated ejection fraction was in the range of 55% to 60%. Diastolic function is abnormal, indeterminate grade. Wall motion was normal; there were no regional wall motion abnormalities. - Aortic valve: Valve area (VTI): 2.94 cm^2. Valve area (Vmax): 2.67 cm^2. - Atrial septum: No defect or patent foramen ovale was identified. - Technically adequate study.  Antibiotics:    HPI/Subjective: Reported headache last night. No neuro changes reported.  Some headache but feeling better otherwise, strength improving, no neuro changes. "I'm ready to go home".  Objective: Filed Vitals:   11/29/14 1430 11/29/14 1841 11/29/14 2222 11/30/14 0612  BP: 126/67 115/56 119/74 132/72  Pulse: 55 54 50 52  Temp: 98.5 F (36.9 C) 98.2 F (36.8 C) 97.5 F (36.4 C) 98.3 F (36.8 C)  TempSrc: Axillary Axillary Axillary Axillary  Resp: 18 18 16 15   Height:      Weight:      SpO2: 100% 98% 98% 97%    Intake/Output Summary (Last 24 hours) at 11/30/14 0909 Last data filed at 11/29/14 2015  Gross per 24 hour  Intake    360 ml  Output   1050 ml  Net   -690 ml     Filed Weights   11/28/14 0801  Weight: 81.647 kg (180 lb)    Exam:     Afebrile, VSS, no hypoxia General:  Appears calm and comfortable, alert. Eyes: PERRL, normal lids, irises. EOMI intact. ENT: grossly normal hearing, lips   Cardiovascular: RRR, no m/r/g. No LE edema. Telemetry: SR/SB, no arrhythmias  Respiratory: CTA bilaterally,  no w/r/r. Normal respiratory effort. Musculoskeletal: grossly normal tone BUE/BLE. Moves all extremities well. Improved strength LUE, uses to pull up in bed. Lifts left leg off bed. Mild left facial weakness stable. Easily lifts left leg off bed. Psychiatric: grossly normal mood and affect, speech fluent and  appropriate Neurologic: as above, improved. CN otherwise stable  New data reviewed:  ESR 8  Pertinent data since admission:  CMP, troponin, CBC, INR, aPTT unremarkable  LDL 81  Hgb A1c 6.2  Serum alcohol 18 on admission  UDS positive for THC  MRI/A brain/head IMPRESSION: Embolic occlusion of the right middle cerebral artery at the M1 M2 junction. Persistent flow in a small anterior temporal branch.  Acute infarction affecting approximately 50% of the right MCA territory. Mild swelling but no hemorrhage or shift.  Carotid u/s bilateral unremarkable  EKG SR  TT Echocardiogram unremarkable  Pending data:  Homocysteine, antiphospholipid antibody, cardiolipin antibody, protein S, protein C  Scheduled Meds: . aspirin  325 mg Oral Daily  . enoxaparin (LOVENOX) injection  30 mg Subcutaneous Q24H  . LORazepam  1 mg Intravenous Once  . simvastatin  20 mg Oral q1800   Continuous Infusions:   Principal Problem:   Acute right MCA stroke Active Problems:   Stroke   Left-sided weakness   Dysphagia   Tobacco abuse

## 2014-12-04 NOTE — Discharge Instructions (Signed)
Esophagogastroduodenoscopy °Care After °Refer to this sheet in the next few weeks. These instructions provide you with information on caring for yourself after your procedure. Your caregiver may also give you more specific instructions. Your treatment has been planned according to current medical practices, but problems sometimes occur. Call your caregiver if you have any problems or questions after your procedure.  °HOME CARE INSTRUCTIONS °· Do not eat or drink anything until the numbing medicine (local anesthetic) has worn off and your gag reflex has returned. You will know that the local anesthetic has worn off when you can swallow comfortably. °· Do not drive for 12 hours after the procedure or as directed by your caregiver. °· Only take medicines as directed by your caregiver. °SEEK MEDICAL CARE IF:  °· You cannot stop coughing. °· You are not urinating at all or less than usual. °SEEK IMMEDIATE MEDICAL CARE IF: °· You have difficulty swallowing. °· You cannot eat or drink. °· You have worsening throat or chest pain. °· You have dizziness, lightheadedness, or you faint. °· You have nausea or vomiting. °· You have chills. °· You have a fever. °· You have severe abdominal pain. °· You have black, tarry, or bloody stools. °Document Released: 08/02/2012 Document Reviewed: 08/02/2012 °ExitCare® Patient Information ©2015 [employer]. This information is not intended to replace advice given to you by your health care provider. Make sure you discuss any questions you have with your health care provider. ° °

## 2014-12-04 NOTE — Progress Notes (Signed)
  Echocardiogram Echocardiogram Transesophageal has been performed.  Stacey DrainWhite, Kahealani Yankovich J 12/04/2014, 2:47 PM

## 2014-12-04 NOTE — Procedures (Signed)
Preliminary TEE report: Normal LV systolic function, EF 60%. Elongated and mild to moderately thickened anterior mitral leaflet. Trivial mitral, tricuspid, and pulmonic regurgitation. No left atrial appendage thrombus. No descending aortic atherosclerotic plaque. No evidence of patent foramen ovale/atrial septal defect by color Doppler and agitated saline contrast study.

## 2014-12-04 NOTE — Interval H&P Note (Signed)
History and Physical Interval Note: No interval changes. Will proceed with TEE as planned.  12/04/2014 12:15 PM  Fred Bell  has presented today for surgery, with the diagnosis of stroke  The various methods of treatment have been discussed with the patient and family. After consideration of risks, benefits and other options for treatment, the patient has consented to  Procedure(s): TRANSESOPHAGEAL ECHOCARDIOGRAM (TEE) (N/A) as a surgical intervention .  The patient's history has been reviewed, patient examined, no change in status, stable for surgery.  I have reviewed the patient's chart and labs.  Questions were answered to the patient's satisfaction.     Prentice DockerKONESWARAN, SURESH A

## 2014-12-04 NOTE — Progress Notes (Signed)
Patient discharged and transported to Cardiology via wheelchair with wife for appointment in Cardiology.

## 2014-12-05 ENCOUNTER — Encounter (HOSPITAL_COMMUNITY): Payer: Self-pay | Admitting: Cardiovascular Disease

## 2014-12-05 LAB — ANTIPHOSPHOLIPID SYNDROME EVAL, BLD
Anticardiolipin IgM: <9 MPL U/mL (ref 0–12)
DRVVT: 37.6 s (ref 0.0–55.1)
PTT Lupus Anticoagulant: 29.8 s (ref 0.0–50.0)
Phosphatydalserine, IgG: 3 GPS IgG (ref 0–11)
Phosphatydalserine, IgM: 5 MPS IgM (ref 0–25)

## 2015-01-06 ENCOUNTER — Telehealth: Payer: Self-pay | Admitting: *Deleted

## 2015-01-06 NOTE — Telephone Encounter (Signed)
EOS placed in Dr. Ival BibleMcDowell's folder

## 2015-01-30 ENCOUNTER — Encounter (HOSPITAL_COMMUNITY): Payer: Self-pay | Admitting: *Deleted

## 2015-01-30 ENCOUNTER — Emergency Department (HOSPITAL_COMMUNITY): Payer: Self-pay

## 2015-01-30 ENCOUNTER — Emergency Department (HOSPITAL_COMMUNITY)
Admission: EM | Admit: 2015-01-30 | Discharge: 2015-01-31 | Disposition: A | Discharge status: Home / Self Care | Payer: Self-pay | Attending: Emergency Medicine | Admitting: Emergency Medicine

## 2015-01-30 DIAGNOSIS — R51 Headache: Secondary | ICD-10-CM | POA: Insufficient documentation

## 2015-01-30 DIAGNOSIS — Z720 Tobacco use: Secondary | ICD-10-CM | POA: Insufficient documentation

## 2015-01-30 DIAGNOSIS — Z8739 Personal history of other diseases of the musculoskeletal system and connective tissue: Secondary | ICD-10-CM | POA: Insufficient documentation

## 2015-01-30 DIAGNOSIS — Z7982 Long term (current) use of aspirin: Secondary | ICD-10-CM | POA: Insufficient documentation

## 2015-01-30 DIAGNOSIS — Z8673 Personal history of transient ischemic attack (TIA), and cerebral infarction without residual deficits: Secondary | ICD-10-CM | POA: Insufficient documentation

## 2015-01-30 DIAGNOSIS — R29810 Facial weakness: Secondary | ICD-10-CM | POA: Insufficient documentation

## 2015-01-30 DIAGNOSIS — R519 Headache, unspecified: Secondary | ICD-10-CM

## 2015-01-30 LAB — CBC WITH DIFFERENTIAL/PLATELET
Basophils Absolute: 0 10*3/uL (ref 0.0–0.1)
Basophils Relative: 0 % (ref 0–1)
EOS ABS: 0.1 10*3/uL (ref 0.0–0.7)
EOS PCT: 2 % (ref 0–5)
HCT: 43.6 % (ref 39.0–52.0)
Hemoglobin: 14.6 g/dL (ref 13.0–17.0)
LYMPHS ABS: 3.1 10*3/uL (ref 0.7–4.0)
LYMPHS PCT: 56 % — AB (ref 12–46)
MCH: 30 pg (ref 26.0–34.0)
MCHC: 33.5 g/dL (ref 30.0–36.0)
MCV: 89.5 fL (ref 78.0–100.0)
MONOS PCT: 9 % (ref 3–12)
Monocytes Absolute: 0.5 10*3/uL (ref 0.1–1.0)
NEUTROS PCT: 33 % — AB (ref 43–77)
Neutro Abs: 1.8 10*3/uL (ref 1.7–7.7)
Platelets: 149 10*3/uL — ABNORMAL LOW (ref 150–400)
RBC: 4.87 MIL/uL (ref 4.22–5.81)
RDW: 13.5 % (ref 11.5–15.5)
WBC: 5.5 10*3/uL (ref 4.0–10.5)

## 2015-01-30 LAB — BASIC METABOLIC PANEL
ANION GAP: 8 (ref 5–15)
BUN: 10 mg/dL (ref 6–20)
CO2: 28 mmol/L (ref 22–32)
Calcium: 9.2 mg/dL (ref 8.9–10.3)
Chloride: 102 mmol/L (ref 101–111)
Creatinine, Ser: 0.9 mg/dL (ref 0.61–1.24)
GLUCOSE: 94 mg/dL (ref 65–99)
Potassium: 4.3 mmol/L (ref 3.5–5.1)
Sodium: 138 mmol/L (ref 135–145)

## 2015-01-30 LAB — ETHANOL: Alcohol, Ethyl (B): 53 mg/dL — ABNORMAL HIGH (ref <5)

## 2015-01-30 MED ORDER — DEXAMETHASONE SODIUM PHOSPHATE 10 MG/ML IJ SOLN
10.0000 mg | Freq: Once | INTRAMUSCULAR | Status: AC
  Administered 2015-01-30: 10 mg via INTRAMUSCULAR
  Filled 2015-01-30: qty 1

## 2015-01-30 MED ORDER — DIPHENHYDRAMINE HCL 25 MG PO CAPS
25.0000 mg | ORAL_CAPSULE | Freq: Once | ORAL | Status: AC
  Administered 2015-01-30: 25 mg via ORAL
  Filled 2015-01-30: qty 1

## 2015-01-30 MED ORDER — METOCLOPRAMIDE HCL 5 MG/ML IJ SOLN
10.0000 mg | Freq: Once | INTRAMUSCULAR | Status: AC
  Administered 2015-01-30: 10 mg via INTRAMUSCULAR
  Filled 2015-01-30: qty 2

## 2015-01-30 NOTE — ED Notes (Addendum)
Pt c/o headache x 2 days and left arm pain x 25 mins. Pt had a stroke March 31st, 2016. Pt's wife states he has a blood clot in his brain. Pt ambulated from triage to room with no problems. Grips equal, left leg weakness.

## 2015-01-30 NOTE — ED Notes (Signed)
MD has been in the room. Abel to assess pt.

## 2015-01-31 LAB — RAPID URINE DRUG SCREEN, HOSP PERFORMED
Amphetamines: NOT DETECTED
BARBITURATES: NOT DETECTED
Benzodiazepines: NOT DETECTED
COCAINE: NOT DETECTED
OPIATES: POSITIVE — AB
Tetrahydrocannabinol: POSITIVE — AB

## 2015-01-31 NOTE — ED Provider Notes (Signed)
CSN: [id number]     Arrival date & time 01/30/15  2123 History   First MD Initiated Contact with Patient 01/30/15 2127     Chief Complaint  Patient presents with  . Headache     (Consider location/radiation/quality/duration/timing/severity/associated sxs/prior Treatment) The history is provided by the patient and the spouse.   Fred Bell is a 54 y.o. male presenting with intermittent headaches since his right sided stroke which left him with left sided facial, upper and lower extremity weakness which occurred 2 months ago.  Wife at the bedside states she has been hesitant to give him the hydrocodone prescribed for his headaches as they make him so sleepy, but did give him 1/2 tablet prior to arrival with no improvement in headache.  There has been no recognized worsened weakness or deficits and he has made improvement with home health pt and ot which has stopped since he "passed" their program, however continues to have left sided weakness.  She notes that he has difficulty sleeping and also has episodes of staring off similar to when he first had his stroke, and more pronounced today.  He has had no seizure like activity. Frustration expressed over inability to obtain f/u neurology care, was unable to pay to see local neurology and was unable to be seen by [facility].  He does have coverage with the "[employer] [facility]" while he is awaiting disability coverage.  Denies fevers, chills, nausea, vomiting, photophobia or visual changes.  He does continue to drink etoh in small quantities and smokes marijuana to help stimulate appetite.     Past Medical History  Diagnosis Date  . Lumbar disc disease   . Stroke    Past Surgical History  Procedure Laterality Date  . Tee without cardioversion N/A 12/04/2014    Procedure: TRANSESOPHAGEAL ECHOCARDIOGRAM (TEE);  Surgeon: Laqueta Linden, MD;  Location: [facility] ENDO SUITE;  Service: Cardiology;  Laterality: N/A;   History reviewed. No pertinent family  history. History  Substance Use Topics  . Smoking status: Current Every Day Smoker -- 0.50 packs/day    Types: Cigarettes  . Smokeless tobacco: Not on file  . Alcohol Use: Yes     Comment: beer daily    Review of Systems  Constitutional: Negative for fever and chills.  HENT: Negative for congestion and sore throat.   Eyes: Negative.  Negative for photophobia and visual disturbance.  Respiratory: Negative for chest tightness and shortness of breath.   Cardiovascular: Negative for chest pain.  Gastrointestinal: Negative for nausea, vomiting and abdominal pain.  Genitourinary: Negative.   Musculoskeletal: Negative for joint swelling, arthralgias and neck pain.  Skin: Negative.  Negative for rash and wound.  Neurological: Positive for weakness and headaches. Negative for dizziness, seizures, speech difficulty, light-headedness and numbness.       Per hpi, baseline.  Psychiatric/Behavioral: Negative.       Allergies  Review of patient's allergies indicates no known allergies.  Home Medications   Prior to Admission medications   Medication Sig Start Date End Date Taking? Authorizing Provider  aspirin 81 MG tablet Take 1 tablet (81 mg total) by mouth daily. 11/30/14  Yes Standley Brooking, MD  HYDROcodone-acetaminophen (NORCO/VICODIN) 5-325 MG per tablet Take 1 tablet by mouth every 6 (six) hours as needed for moderate pain. Patient taking differently: Take 0.5 tablets by mouth every 6 (six) hours as needed for moderate pain.  11/30/14  Yes Standley Brooking, MD  lovastatin (MEVACOR) 20 MG tablet Take 1 tablet (20 mg  total) by mouth at bedtime. 11/30/14  Yes Standley Brookinganiel P Goodrich, MD   BP 119/79 mmHg  Pulse 56  Temp(Src) 98.1 F (36.7 C)  Resp 18  Ht 6' (1.829 m)  Wt 176 lb (79.833 kg)  BMI 23.86 kg/m2  SpO2 98% Physical Exam  Constitutional: He is oriented to person, place, and time. He appears well-developed and well-nourished. No distress.  Uncomfortable appearing  HENT:  Head:  Normocephalic and atraumatic.  Right Ear: No tenderness. Tympanic membrane is not erythematous and not bulging.  Left Ear: No tenderness. Tympanic membrane is not erythematous and not bulging.  Mouth/Throat: Oropharynx is clear and moist.  Eyes: Conjunctivae and EOM are normal. Pupils are equal, round, and reactive to light.  Neck: Normal range of motion. Neck supple.  Cardiovascular: Normal rate and normal heart sounds.   Pulmonary/Chest: Effort normal.  Abdominal: Soft. There is no tenderness.  Musculoskeletal: Normal range of motion.  Lymphadenopathy:    He has no cervical adenopathy.  Neurological: He is alert and oriented to person, place, and time. He has normal strength. No sensory deficit. Gait normal. GCS eye subscore is 4. GCS verbal subscore is 5. GCS motor subscore is 6.  Mild left facial droop, noticeable only with exaggerated smiling.Grip strength 5/5 right, 4.5/5 left. No pronator drift.  Skin: Skin is warm and dry. No rash noted.  Psychiatric: He has a normal mood and affect. His speech is normal and behavior is normal. Thought content normal. Cognition and memory are normal.  Nursing note and vitals reviewed.   ED Course  Procedures (including critical care time) Labs Review Labs Reviewed  CBC WITH DIFFERENTIAL/PLATELET - Abnormal; Notable for the following:    Platelets 149 (*)    Neutrophils Relative % 33 (*)    Lymphocytes Relative 56 (*)    All other components within normal limits  URINE RAPID DRUG SCREEN (HOSP PERFORMED) NOT AT [facility] - Abnormal; Notable for the following:    Opiates POSITIVE (*)    Tetrahydrocannabinol POSITIVE (*)    All other components within normal limits  ETHANOL - Abnormal; Notable for the following:    Alcohol, Ethyl (B) 53 (*)    All other components within normal limits  BASIC METABOLIC PANEL    Imaging Review Ct Head Wo Contrast  01/31/2015   CLINICAL DATA:  Headache for 2 days.  Left arm pain.  EXAM: CT HEAD WITHOUT CONTRAST   TECHNIQUE: Contiguous axial images were obtained from the base of the skull through the vertex without intravenous contrast.  COMPARISON:  Head CT and brain MRI 11/28/2014  FINDINGS: Expected evolution of right MCA distribution infarct from prior exam with encephalomalacia. No evidence of acute ischemia. Mild ex vacuo dilatation of the right lateral ventricle. No intracranial hemorrhage. Mild chronic small vessel ischemic change. No subdural or extra-axial fluid collection. No hydrocephalus. The calvarium is intact. Included paranasal sinuses and mastoid air cells are well aerated.  IMPRESSION: Expected evolution of right MCA distribution infarct from prior exam. No acute intracranial abnormality is seen.   Electronically Signed   By: Rubye OaksMelanie  Ehinger M.D.   On: 01/31/2015 00:02     EKG Interpretation   Date/Time:  Thursday January 30 2015 21:42:48 EDT Ventricular Rate:  60 PR Interval:  176 QRS Duration: 91 QT Interval:  412 QTC Calculation: 412 R Axis:   70 Text Interpretation:  Sinus rhythm Probable left atrial enlargement No  significant change was found Confirmed by Manus GunningANCOUR  MD, STEPHEN [phone] on  01/30/2015  9:52:02 PM      MDM   Final diagnoses:  Acute nonintractable headache, unspecified headache type    Patients labs and/or radiological studies were reviewed and considered during the medical decision making and disposition process.  Results were also discussed with patient. Pt discussed with Dr Estell Harpin during this visit.  He was given migraine cocktail with resolution of headache.  Referral to [facility] for f/u care.  Advised she may give pt full hydrocodone if needed for headache relief if sx return.  Prn f/u anticipated.  The patient appears reasonably screened and/or stabilized for discharge and I doubt any other medical condition or other [facility] requiring further screening, evaluation, or treatment in the ED at this time prior to discharge.     Burgess Amor, PA-C 01/31/15  1627  Bethann Berkshire, MD 02/03/15 [phone]

## 2015-01-31 NOTE — Discharge Instructions (Signed)
General Headache Without Cause A general headache is pain or discomfort felt around the head or neck area. The cause may not be found.  HOME CARE   Keep all doctor visits.  Only take medicines as told by your doctor.  Lie down in a dark, quiet room when you have a headache.  Keep a journal to find out if certain things bring on headaches. For example, write down:  What you eat and drink.  How much sleep you get.  Any change to your diet or medicines.  Relax by getting a massage or doing other relaxing activities.  Put ice or heat packs on the head and neck area as told by your doctor.  Lessen stress.  Sit up straight. Do not tighten (tense) your muscles.  Quit smoking if you smoke.  Lessen how much alcohol you drink.  Lessen how much caffeine you drink, or stop drinking caffeine.  Eat and sleep on a regular schedule.  Get 7 to 9 hours of sleep, or as told by your doctor.  Keep lights dim if bright lights bother you or make your headaches worse. GET HELP RIGHT AWAY IF:   Your headache becomes really bad.  You have a fever.  You have a stiff neck.  You have trouble seeing.  Your muscles are weak, or you lose muscle control.  You lose your balance or have trouble walking.  You feel like you will pass out (faint), or you pass out.  You have really bad symptoms that are different than your first symptoms.  You have problems with the medicines given to you by your doctor.  Your medicines do not work.  Your headache feels different than the other headaches.  You feel sick to your stomach (nauseous) or throw up (vomit). MAKE SURE YOU:   Understand these instructions.  Will watch your condition.  Will get help right away if you are not doing well or get worse. Document Released: 05/25/2008 Document Revised: 11/08/2011 Document Reviewed: 08/06/2011 [facility] Patient Information 2015 [place], [place]. This information is not intended to replace advice given to  you by your health care provider. Make sure you discuss any questions you have with your health care provider.   Your labs and CT tonight show no sign of any new complications related to your stroke.  You may take the hydrocodone you were prescribed when you were admitted here if needed for an occasional headache.  You may also simply try tylenol which can be effective.

## 2015-04-15 ENCOUNTER — Ambulatory Visit (INDEPENDENT_AMBULATORY_CARE_PROVIDER_SITE_OTHER): Payer: Medicaid Other | Admitting: Neurology

## 2015-04-15 ENCOUNTER — Encounter: Payer: Self-pay | Admitting: Neurology

## 2015-04-15 ENCOUNTER — Other Ambulatory Visit (INDEPENDENT_AMBULATORY_CARE_PROVIDER_SITE_OTHER): Payer: Self-pay

## 2015-04-15 VITALS — BP 126/62 | HR 66 | Resp 20 | Ht 72.0 in | Wt 145.0 lb

## 2015-04-15 DIAGNOSIS — F329 Major depressive disorder, single episode, unspecified: Secondary | ICD-10-CM

## 2015-04-15 DIAGNOSIS — I639 Cerebral infarction, unspecified: Secondary | ICD-10-CM

## 2015-04-15 DIAGNOSIS — F32A Depression, unspecified: Secondary | ICD-10-CM

## 2015-04-15 DIAGNOSIS — I63511 Cerebral infarction due to unspecified occlusion or stenosis of right middle cerebral artery: Secondary | ICD-10-CM

## 2015-04-15 DIAGNOSIS — I69959 Hemiplegia and hemiparesis following unspecified cerebrovascular disease affecting unspecified side: Secondary | ICD-10-CM | POA: Insufficient documentation

## 2015-04-15 LAB — LIPID PANEL
Cholesterol: 161 mg/dL (ref 0–200)
HDL: 64.3 mg/dL (ref >39.00)
LDL Cholesterol: 81 mg/dL (ref 0–99)
NonHDL: 96.77
TRIGLYCERIDES: 77 mg/dL (ref 0.0–149.0)
Total CHOL/HDL Ratio: 3
VLDL: 15.4 mg/dL (ref 0.0–40.0)

## 2015-04-15 MED ORDER — ESCITALOPRAM OXALATE 10 MG PO TABS: 10.0000 mg | ORAL_TABLET | Freq: Every day | ORAL | Status: DC

## 2015-04-15 NOTE — Patient Instructions (Addendum)
Continue aspirin  daily  STOP SMOKING Continue cholesterol medication.  Will check another cholesterol level (fasting lipid profile) I think you should look into disability.  Start Lexapro (escitalopram)  daily for depression and irritability Follow up in 6 months.

## 2015-04-15 NOTE — Progress Notes (Signed)
NEUROLOGY CONSULTATION NOTE  Fred Bell MRN: [id number] DOB: Jan 18, 1961  Referring provider: Bethann Berkshire, MD (ED) Primary care provider: Lia Hopping, MD  Reason for consult:  Stroke  HISTORY OF PRESENT ILLNESS: Fred Bell is a 54 year old right-handed male with tobacco dependence and recent stroke who presents for stroke.  History obtained by patient, his wife and hospital notes from March.  Images of MRI from March as well as labs and test reports reviewed.  He had an acute right MCA territory stroke in March 2016, presenting as left sided facial weakness, left upper and lower extremity weakness, and dysphagia.  He was admitted to [facility] where he underwent a stroke workup.  Stroke was diagnosed by MRI of brain.  TTE and carotid ultrasound were unremarkable.  Telemetry revealed no atrial fibrillation.  Hgb A1c was 6.2.  LDL was 81.  He was started on ASA  and lovastatin .  He had an outpatient TEE, which showed no cardiac source of emboli.  Hypercoagulable panel, including Sed Rate, homocysteine, antiphospholipid antibody, cardiolipin antibody, protein C & S, were negative.  He received home PT/OT.  At baseline, he still has some weakness on the left side.  His left arm "feels funny."  He sometimes drags his left leg.  He also exhibits apraxia.  He sometimes puts his clothes on backwards.  He sometimes is unable to figure out how to put things together. For example, he took apart his compressor and was unable to put it together again.  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  He is also more irritable and depressed regarding his current situation.  Since the stroke, he has had headache.  They are a severe throbbing, holocephalic pain without associated symptoms.  They last all day and occur about once a month.  He presented to the ED on 01/30/15, where he received a migraine cocktail and discharged with hydrocodone.  He takes tylenol for  it.  PAST MEDICAL HISTORY: Past Medical History  Diagnosis Date  . Lumbar disc disease   . Stroke     PAST SURGICAL HISTORY: Past Surgical History  Procedure Laterality Date  . Tee without cardioversion N/A 12/04/2014    Procedure: TRANSESOPHAGEAL ECHOCARDIOGRAM (TEE);  Surgeon: Laqueta Linden, MD;  Location: [facility];  Service: Cardiology;  Laterality: N/A;    MEDICATIONS: Current Outpatient Prescriptions on File Prior to Visit  Medication Sig Dispense Refill  . aspirin 81 MG tablet Take 1 tablet (81 mg total) by mouth daily.    Marland Kitchen HYDROcodone-acetaminophen (NORCO/VICODIN) 5-325 MG per tablet Take 1 tablet by mouth every 6 (six) hours as needed for moderate pain. (Patient taking differently: Take 0.5 tablets by mouth every 6 (six) hours as needed for moderate pain. ) 20 tablet 0  . lovastatin (MEVACOR) 20 MG tablet Take 1 tablet (20 mg total) by mouth at bedtime. 30 tablet 0   No current facility-administered medications on file prior to visit.    ALLERGIES: No Known Allergies  FAMILY HISTORY: Family History  Problem Relation Age of Onset  . Diabetes Maternal Grandmother   . Hypertension Sister     SOCIAL HISTORY: Social History   Social History  . Marital Status: Married    Spouse Name: N/A  . Number of Children: N/A  . Years of Education: N/A   Occupational History  . Not on file.   Social History Main Topics  . Smoking status: Current Every Day Smoker --  0.50 packs/day    Types: Cigarettes  . Smokeless tobacco: Not on file  . Alcohol Use: 0.0 oz/week    0 Standard drinks or equivalent per week     Comment: beer daily  . Drug Use: Yes    Special: Marijuana     Comment: denies  . Sexual Activity:    Partners: Female   Other Topics Concern  . Not on file   Social History Narrative    REVIEW OF SYSTEMS: Constitutional: No fevers, chills, or sweats, no generalized fatigue, change in appetite Eyes: No visual changes, double vision, eye  pain Ear, nose and throat: No hearing loss, ear pain, nasal congestion, sore throat Cardiovascular: No chest pain, palpitations Respiratory:  No shortness of breath at rest or with exertion, wheezes GastrointestinaI: No nausea, vomiting, diarrhea, abdominal pain, fecal incontinence Genitourinary:  No dysuria, urinary retention or frequency Musculoskeletal:  No neck pain, back pain Integumentary: No rash, pruritus, skin lesions Neurological: as above Psychiatric: Depression, insomnia, irritability Endocrine: No palpitations, fatigue, diaphoresis, mood swings, change in appetite, change in weight, increased thirst Hematologic/Lymphatic:  No anemia, purpura, petechiae. Allergic/Immunologic: no itchy/runny eyes, nasal congestion, recent allergic reactions, rashes  PHYSICAL EXAM: Filed Vitals:   04/15/15 0813  BP: 126/62  Pulse: 66  Resp: 20   General: No acute distress.  Patient appears well-groomed.   Head:  Normocephalic/atraumatic Eyes:  fundi unremarkable, without vessel changes, exudates, hemorrhages or papilledema. Neck: supple, no paraspinal tenderness, full range of motion Back: No paraspinal tenderness Heart: regular rate and rhythm Lungs: Clear to auscultation bilaterally. Vascular: No carotid bruits. Neurological Exam: Mental status: alert and oriented to person, place, and time, recent and remote memory intact, fund of knowledge intact, attention and concentration intact, speech fluent and not dysarthric, language intact. Cranial nerves: CN I: not tested CN II: pupils equal, round and reactive to light, visual fields intact, fundi unremarkable, without vessel changes, exudates, hemorrhages or papilledema. CN III, IV, VI:  full range of motion, no nystagmus, no ptosis CN V: reduced sensation in left V1-V3. CN VII: upper and lower face symmetric CN VIII: hearing intact CN IX, X: gag intact, uvula midline CN XI: sternocleidomastoid and trapezius muscles intact CN XII:  tongue midline Bulk & Tone: normal, no fasciculations. Motor:  5- left deltoid, triceps and grip.  Otherwise, 5/5. Sensation:  Reduced pinprick and vibration sensation in left upper and lower extremities Deep Tendon Reflexes:  2+ on right, 3+ on left, toes downgoing. Finger to nose testing:  Slowed on the left but no dysmetria Gait:  Slowed and cautious.  No ataxia.  Able to tandem walk. Romberg negative.  IMPRESSION: Right MCA territorial infarct, cryptogenic with hemiplegia of non-dominant side.  Besides left-sided weakness, he exhibits apraxia.  Therefore, I don't believe he is able to continue working at his job.   Headache Tobacco abuse Depression  PLAN: 1.  Continue ASA 81mg  daily 2.  Continue statin.  Will check another fasting lipid panel 3.  Discussed the importance of smoking cessation 4.  Advil or Aleve for headaches.  Thankfully, they are infrequent. 5.  Lexapro 10mg  daily for irritability and depression 6.  I recommended that they look into disability   7.  Provided information regarding sleep hygiene 8.  Follow up in 6 months.   Thank you for allowing me to take part in the care of this patient.  Shon Millet, DO  CC:  Lia Hopping, MD

## 2015-04-26 ENCOUNTER — Encounter (HOSPITAL_COMMUNITY): Payer: Self-pay | Admitting: Emergency Medicine

## 2015-04-26 ENCOUNTER — Emergency Department (HOSPITAL_COMMUNITY): Payer: Self-pay

## 2015-04-26 ENCOUNTER — Emergency Department (HOSPITAL_COMMUNITY)
Admission: EM | Admit: 2015-04-26 | Discharge: 2015-04-27 | Disposition: A | Discharge status: Home / Self Care | Payer: Self-pay | Attending: Emergency Medicine | Admitting: Emergency Medicine

## 2015-04-26 DIAGNOSIS — Z7982 Long term (current) use of aspirin: Secondary | ICD-10-CM | POA: Insufficient documentation

## 2015-04-26 DIAGNOSIS — F10920 Alcohol use, unspecified with intoxication, uncomplicated: Secondary | ICD-10-CM

## 2015-04-26 DIAGNOSIS — Z720 Tobacco use: Secondary | ICD-10-CM | POA: Insufficient documentation

## 2015-04-26 DIAGNOSIS — Z79899 Other long term (current) drug therapy: Secondary | ICD-10-CM | POA: Insufficient documentation

## 2015-04-26 DIAGNOSIS — R079 Chest pain, unspecified: Secondary | ICD-10-CM | POA: Insufficient documentation

## 2015-04-26 DIAGNOSIS — Z8739 Personal history of other diseases of the musculoskeletal system and connective tissue: Secondary | ICD-10-CM | POA: Insufficient documentation

## 2015-04-26 DIAGNOSIS — F10120 Alcohol abuse with intoxication, uncomplicated: Secondary | ICD-10-CM | POA: Insufficient documentation

## 2015-04-26 DIAGNOSIS — Z8673 Personal history of transient ischemic attack (TIA), and cerebral infarction without residual deficits: Secondary | ICD-10-CM | POA: Insufficient documentation

## 2015-04-26 LAB — CBC
HCT: 41 % (ref 39.0–52.0)
Hemoglobin: 13.8 g/dL (ref 13.0–17.0)
MCH: 30.7 pg (ref 26.0–34.0)
MCHC: 33.7 g/dL (ref 30.0–36.0)
MCV: 91.3 fL (ref 78.0–100.0)
PLATELETS: 160 10*3/uL (ref 150–400)
RBC: 4.49 MIL/uL (ref 4.22–5.81)
RDW: 13.8 % (ref 11.5–15.5)
WBC: 6.1 10*3/uL (ref 4.0–10.5)

## 2015-04-26 LAB — BASIC METABOLIC PANEL
Anion gap: 9 (ref 5–15)
BUN: 8 mg/dL (ref 6–20)
CO2: 27 mmol/L (ref 22–32)
CREATININE: 0.93 mg/dL (ref 0.61–1.24)
Calcium: 8.6 mg/dL — ABNORMAL LOW (ref 8.9–10.3)
Chloride: 106 mmol/L (ref 101–111)
GFR calc Af Amer: >60 mL/min (ref >60)
GFR calc non Af Amer: >60 mL/min (ref >60)
Glucose, Bld: 83 mg/dL (ref 65–99)
Potassium: 3.9 mmol/L (ref 3.5–5.1)
SODIUM: 142 mmol/L (ref 135–145)

## 2015-04-26 LAB — TROPONIN I: Troponin I: <0.03 ng/mL (ref <0.031)

## 2015-04-26 MED ORDER — SODIUM CHLORIDE 0.9 % IV SOLN
1000.0000 mL | Freq: Once | INTRAVENOUS | Status: AC
  Administered 2015-04-26: 1000 mL via INTRAVENOUS

## 2015-04-26 MED ORDER — SODIUM CHLORIDE 0.9 % IV SOLN
1000.0000 mL | INTRAVENOUS | Status: DC
  Administered 2015-04-27: 1000 mL via INTRAVENOUS

## 2015-04-26 NOTE — ED Notes (Signed)
Pt states he was outside in heat, drinking beer.  Started having chest tightness.

## 2015-04-26 NOTE — ED Provider Notes (Addendum)
CSN: [id number]     Arrival date & time 04/26/15  2206 History  This chart was scribed for Dione Booze, MD by Phillis Haggis, ED Scribe. This patient was seen in room [facility]08/[facility]08 and patient care was started at 11:25 PM.   Chief Complaint  Patient presents with  . Chest Pain   The history is provided by the patient. No language interpreter was used.  HPI Comments: Fred Bell is a 54 y.o. Male with hx of stroke who presents to the Emergency Department brought in by EMS complaining of chest tightness onset 3 hours ago. He states that he was outside in the heat all day, drinking about 6 beers. He states that the tightness has subsided since being in the ED. Family member states that the pt has recently complained of dizziness. Pt states that he is currently hungry. He states that he has hx of sun stroke and symptoms feel similar. He denies nausea, vomiting, SOB, or diaphoresis. Pt reports that he is an everyday smoker.   Past Medical History  Diagnosis Date  . Lumbar disc disease   . Stroke    Past Surgical History  Procedure Laterality Date  . Tee without cardioversion N/A 12/04/2014    Procedure: TRANSESOPHAGEAL ECHOCARDIOGRAM (TEE);  Surgeon: Laqueta Linden, MD;  Location: [facility];  Service: Cardiology;  Laterality: N/A;   Family History  Problem Relation Age of Onset  . Diabetes Maternal Grandmother   . Hypertension Sister    Social History  Substance Use Topics  . Smoking status: Current Every Day Smoker -- 0.50 packs/day    Types: Cigarettes  . Smokeless tobacco: None  . Alcohol Use: 0.0 oz/week    0 Standard drinks or equivalent per week     Comment: beer daily    Review of Systems  Constitutional: Negative for diaphoresis.  Respiratory: Positive for chest tightness. Negative for shortness of breath.   Gastrointestinal: Negative for nausea and vomiting.  All other systems reviewed and are negative.  Allergies  Review of patient's allergies indicates no known  allergies.  Home Medications   Prior to Admission medications   Medication Sig Start Date End Date Taking? Authorizing Provider  aspirin 81 MG tablet Take 1 tablet (81 mg total) by mouth daily. 11/30/14   Standley Brooking, MD  escitalopram (LEXAPRO) 10 MG tablet Take 1 tablet (10 mg total) by mouth daily. 04/15/15   Drema Dallas, DO  HYDROcodone-acetaminophen (NORCO/VICODIN) 5-325 MG per tablet Take 1 tablet by mouth every 6 (six) hours as needed for moderate pain. Patient taking differently: Take 0.5 tablets by mouth every 6 (six) hours as needed for moderate pain.  11/30/14   Standley Brooking, MD  lovastatin (MEVACOR) 20 MG tablet Take 1 tablet (20 mg total) by mouth at bedtime. 11/30/14   Standley Brooking, MD   BP 139/96 mmHg  Pulse 75  Temp(Src) 98.8 F (37.1 C) (Oral)  Resp 16  Ht 6' (1.829 m)  Wt 148 lb (67.132 kg)  BMI 20.07 kg/m2  SpO2 99%  Physical Exam  Constitutional: He is oriented to person, place, and time. He appears well-developed and well-nourished.  HENT:  Head: Normocephalic and atraumatic.  Eyes: EOM are normal. Pupils are equal, round, and reactive to light.  Neck: Normal range of motion. Neck supple. No JVD present.  Cardiovascular: Normal rate, regular rhythm and normal heart sounds.   No murmur heard. Pulmonary/Chest: Effort normal and breath sounds normal. He has no wheezes. He has  no rales. He exhibits no tenderness.  Abdominal: Bowel sounds are normal. He exhibits no distension and no mass. There is no tenderness.  Musculoskeletal: Normal range of motion. He exhibits no edema.  Lymphadenopathy:    He has no cervical adenopathy.  Neurological: He is alert and oriented to person, place, and time. No cranial nerve deficit. He exhibits normal muscle tone. Coordination normal.  Clinically intoxicated  Skin: Skin is warm and dry. No rash noted.  Psychiatric: He has a normal mood and affect. His behavior is normal.  Nursing note and vitals reviewed.   ED  Course  Procedures (including critical care time) DIAGNOSTIC STUDIES: Oxygen Saturation is 99% on RA, normal by my interpretation.    COORDINATION OF CARE: 11:28 PM-Discussed treatment plan which includes labs and EKG with pt at bedside and pt agreed to plan.   Labs Review Results for orders placed or performed during the hospital encounter of 04/26/15  Basic metabolic panel  Result Value Ref Range   Sodium 142 135 - 145 mmol/L   Potassium 3.9 3.5 - 5.1 mmol/L   Chloride 106 101 - 111 mmol/L   CO2 27 22 - 32 mmol/L   Glucose, Bld 83 65 - 99 mg/dL   BUN 8 6 - 20 mg/dL   Creatinine, Ser 1.61 0.61 - 1.24 mg/dL   Calcium 8.6 (L) 8.9 - 10.3 mg/dL   GFR calc non Af Amer >60 >60 mL/min   GFR calc Af Amer >60 >60 mL/min   Anion gap 9 5 - 15  CBC  Result Value Ref Range   WBC 6.1 4.0 - 10.5 K/uL   RBC 4.49 4.22 - 5.81 MIL/uL   Hemoglobin 13.8 13.0 - 17.0 g/dL   HCT 09.6 04.5 - 40.9 %   MCV 91.3 78.0 - 100.0 fL   MCH 30.7 26.0 - 34.0 pg   MCHC 33.7 30.0 - 36.0 g/dL   RDW 81.1 91.4 - 78.2 %   Platelets 160 150 - 400 K/uL  Troponin I  Result Value Ref Range   Troponin I <0.03 <0.031 ng/mL  Troponin I  Result Value Ref Range   Troponin I <0.03 <0.031 ng/mL   Imaging Review Dg Chest 2 View  04/26/2015   CLINICAL DATA:  Chest tightness and mild shortness of breath this evening. History of stroke. Smoker.  EXAM: CHEST  2 VIEW  COMPARISON:  11/28/2014  FINDINGS: Normal heart size and pulmonary vascularity. Diffuse hyperinflation. No focal airspace disease or consolidation. No blunting of costophrenic angles. No pneumothorax. Mediastinal contours appear intact. Mild degenerative changes in the spine.  IMPRESSION: Hyperinflation suggesting emphysema. No evidence of active pulmonary disease appear   Electronically Signed   By: Burman Nieves M.D.   On: 04/26/2015 23:42     EKG Interpretation   Date/Time:  Saturday April 26 2015 22:14:41 EDT Ventricular Rate:  74 PR Interval:   159 QRS Duration: 89 QT Interval:  388 QTC Calculation: 430 R Axis:   62 Text Interpretation:  Sinus rhythm Biatrial enlargement ST elev, probable  normal early repol pattern When compared with ECG of 01/30/2015, No  significant change was found Confirmed by [facility]  MD, Amaryllis Malmquist ([id number]) on  04/26/2015 11:06:17 PM      MDM   Final diagnoses:  Chest pain, unspecified chest pain type  Alcohol intoxication, uncomplicated    Chest tightness which has resolved. ECG is unremarkable and initial troponin is negative. Old records are reviewed and he had been evaluated for stroke earlier  this year. He clearly is at increased risk for coronary artery disease. He is kept in the ED and second troponin level is checked and is negative as well. He is discharged with instructions to stop consumption of alcohol and tobacco products. He is to follow-up with his PCP.  I personally performed the services described in this documentation, which was scribed in my presence. The recorded information has been reviewed and is accurate.     Dione Booze, MD 04/27/15 1610  Dione Booze, MD 04/27/15 0300

## 2015-04-27 LAB — TROPONIN I: Troponin I: <0.03 ng/mL (ref <0.031)

## 2015-04-27 NOTE — ED Notes (Signed)
Pt and wife state understanding of care given and follow up instructions.  Ambulated from ED

## 2015-04-27 NOTE — Discharge Instructions (Signed)
Stop smoking and drinking. They increase your risk for another stroke as well as your risk of having a heart attack.  Chest Pain (Nonspecific) It is often hard to give a specific diagnosis for the cause of chest pain. There is always a chance that your pain could be related to something serious, such as a heart attack or a blood clot in the lungs. You need to follow up with your health care provider for further evaluation. CAUSES   Heartburn.  Pneumonia or bronchitis.  Anxiety or stress.  Inflammation around your heart (pericarditis) or lung (pleuritis or pleurisy).  A blood clot in the lung.  A collapsed lung (pneumothorax). It can develop suddenly on its own (spontaneous pneumothorax) or from trauma to the chest.  Shingles infection (herpes zoster virus). The chest wall is composed of bones, muscles, and cartilage. Any of these can be the source of the pain.  The bones can be bruised by injury.  The muscles or cartilage can be strained by coughing or overwork.  The cartilage can be affected by inflammation and become sore (costochondritis). DIAGNOSIS  Lab tests or other studies may be needed to find the cause of your pain. Your health care provider may have you take a test called an ambulatory electrocardiogram (ECG). An ECG records your heartbeat patterns over a 24-hour period. You may also have other tests, such as:  Transthoracic echocardiogram (TTE). During echocardiography, sound waves are used to evaluate how blood flows through your heart.  Transesophageal echocardiogram (TEE).  Cardiac monitoring. This allows your health care provider to monitor your heart rate and rhythm in real time.  Holter monitor. This is a portable device that records your heartbeat and can help diagnose heart arrhythmias. It allows your health care provider to track your heart activity for several days, if needed.  Stress tests by exercise or by giving medicine that makes the heart beat  faster. TREATMENT   Treatment depends on what may be causing your chest pain. Treatment may include:  Acid blockers for heartburn.  Anti-inflammatory medicine.  Pain medicine for inflammatory conditions.  Antibiotics if an infection is present.  You may be advised to change lifestyle habits. This includes stopping smoking and avoiding alcohol, caffeine, and chocolate.  You may be advised to keep your head raised (elevated) when sleeping. This reduces the chance of acid going backward from your stomach into your esophagus. Most of the time, nonspecific chest pain will improve within 2-3 days with rest and mild pain medicine.  HOME CARE INSTRUCTIONS   If antibiotics were prescribed, take them as directed. Finish them even if you start to feel better.  For the next few days, avoid physical activities that bring on chest pain. Continue physical activities as directed.  Do not use any tobacco products, including cigarettes, chewing tobacco, or electronic cigarettes.  Avoid drinking alcohol.  Only take medicine as directed by your health care provider.  Follow your health care provider's suggestions for further testing if your chest pain does not go away.  Keep any follow-up appointments you made. If you do not go to an appointment, you could develop lasting (chronic) problems with pain. If there is any problem keeping an appointment, call to reschedule. SEEK MEDICAL CARE IF:   Your chest pain does not go away, even after treatment.  You have a rash with blisters on your chest.  You have a fever. SEEK IMMEDIATE MEDICAL CARE IF:   You have increased chest pain or pain that spreads to  your arm, neck, jaw, back, or abdomen.  You have shortness of breath.  You have an increasing cough, or you cough up blood.  You have severe back or abdominal pain.  You feel nauseous or vomit.  You have severe weakness.  You faint.  You have chills. This is an emergency. Do not wait to  see if the pain will go away. Get medical help at once. Call your local emergency services (911 in [place] Do not drive yourself to the hospital. MAKE SURE YOU:   Understand these instructions.  Will watch your condition.  Will get help right away if you are not doing well or get worse. Document Released: 05/26/2005 Document Revised: 08/21/2013 Document Reviewed: 03/21/2008 [facility] Patient Information 2015 [place], [place]. This information is not intended to replace advice given to you by your health care provider. Make sure you discuss any questions you have with your health care provider.

## 2015-09-02 ENCOUNTER — Encounter: Payer: Self-pay | Admitting: Neurology

## 2015-09-02 ENCOUNTER — Telehealth: Payer: Self-pay | Admitting: Neurology

## 2015-09-02 NOTE — Telephone Encounter (Signed)
Would like letter for DSS office (for child support) stating that patient is not able to work.  Patient has filled disability paperwork and is currently working on getting that taken care of. Pt is working with [employer] for disability. Please advise.   Letter to be addressed to Child Support Enforcement - [place]

## 2015-09-02 NOTE — Telephone Encounter (Signed)
Letter drafted

## 2015-09-02 NOTE — Telephone Encounter (Signed)
Letter printed, placed at front desk for pickup. Message left for [place].

## 2015-09-02 NOTE — Telephone Encounter (Signed)
Pt wife tina called and needs a letter for child support stating that he will not be able to work again please call [phone]

## 2015-10-07 ENCOUNTER — Emergency Department (HOSPITAL_COMMUNITY): Payer: PRIVATE HEALTH INSURANCE

## 2015-10-07 ENCOUNTER — Encounter (HOSPITAL_COMMUNITY): Payer: Self-pay | Admitting: *Deleted

## 2015-10-07 ENCOUNTER — Emergency Department (HOSPITAL_COMMUNITY)
Admission: EM | Admit: 2015-10-07 | Discharge: 2015-10-07 | Disposition: A | Discharge status: Home / Self Care | Payer: PRIVATE HEALTH INSURANCE | Attending: Emergency Medicine | Admitting: Emergency Medicine

## 2015-10-07 DIAGNOSIS — R0789 Other chest pain: Secondary | ICD-10-CM | POA: Insufficient documentation

## 2015-10-07 DIAGNOSIS — F17210 Nicotine dependence, cigarettes, uncomplicated: Secondary | ICD-10-CM | POA: Diagnosis not present

## 2015-10-07 DIAGNOSIS — Z7982 Long term (current) use of aspirin: Secondary | ICD-10-CM | POA: Insufficient documentation

## 2015-10-07 DIAGNOSIS — R519 Headache, unspecified: Secondary | ICD-10-CM

## 2015-10-07 DIAGNOSIS — Z79899 Other long term (current) drug therapy: Secondary | ICD-10-CM | POA: Diagnosis not present

## 2015-10-07 DIAGNOSIS — R079 Chest pain, unspecified: Secondary | ICD-10-CM | POA: Diagnosis present

## 2015-10-07 DIAGNOSIS — Z8739 Personal history of other diseases of the musculoskeletal system and connective tissue: Secondary | ICD-10-CM | POA: Insufficient documentation

## 2015-10-07 DIAGNOSIS — Z8673 Personal history of transient ischemic attack (TIA), and cerebral infarction without residual deficits: Secondary | ICD-10-CM | POA: Insufficient documentation

## 2015-10-07 DIAGNOSIS — R51 Headache: Secondary | ICD-10-CM | POA: Diagnosis not present

## 2015-10-07 LAB — CBC WITH DIFFERENTIAL/PLATELET
BASOS ABS: 0 10*3/uL (ref 0.0–0.1)
Basophils Relative: 0 %
Eosinophils Absolute: 0.1 10*3/uL (ref 0.0–0.7)
Eosinophils Relative: 1 %
HEMATOCRIT: 40.5 % (ref 39.0–52.0)
Hemoglobin: 13.5 g/dL (ref 13.0–17.0)
LYMPHS PCT: 47 %
Lymphs Abs: 2.2 10*3/uL (ref 0.7–4.0)
MCH: 30.8 pg (ref 26.0–34.0)
MCHC: 33.3 g/dL (ref 30.0–36.0)
MCV: 92.3 fL (ref 78.0–100.0)
MONO ABS: 0.4 10*3/uL (ref 0.1–1.0)
Monocytes Relative: 8 %
Neutro Abs: 2 10*3/uL (ref 1.7–7.7)
Neutrophils Relative %: 44 %
Platelets: 165 10*3/uL (ref 150–400)
RBC: 4.39 MIL/uL (ref 4.22–5.81)
RDW: 13.8 % (ref 11.5–15.5)
WBC: 4.6 10*3/uL (ref 4.0–10.5)

## 2015-10-07 LAB — COMPREHENSIVE METABOLIC PANEL
ALBUMIN: 4.2 g/dL (ref 3.5–5.0)
ALT: 10 U/L — AB (ref 17–63)
AST: 26 U/L (ref 15–41)
Alkaline Phosphatase: 53 U/L (ref 38–126)
Anion gap: 7 (ref 5–15)
BUN: 12 mg/dL (ref 6–20)
CHLORIDE: 104 mmol/L (ref 101–111)
CO2: 27 mmol/L (ref 22–32)
CREATININE: 0.79 mg/dL (ref 0.61–1.24)
Calcium: 9.4 mg/dL (ref 8.9–10.3)
GFR calc Af Amer: >60 mL/min (ref >60)
GLUCOSE: 87 mg/dL (ref 65–99)
POTASSIUM: 4.2 mmol/L (ref 3.5–5.1)
Sodium: 138 mmol/L (ref 135–145)
Total Bilirubin: 0.6 mg/dL (ref 0.3–1.2)
Total Protein: 6.9 g/dL (ref 6.5–8.1)

## 2015-10-07 LAB — URINALYSIS, ROUTINE W REFLEX MICROSCOPIC
Bilirubin Urine: NEGATIVE
GLUCOSE, UA: NEGATIVE mg/dL
Hgb urine dipstick: NEGATIVE
KETONES UR: NEGATIVE mg/dL
LEUKOCYTES UA: NEGATIVE
Nitrite: NEGATIVE
PH: 6 (ref 5.0–8.0)
PROTEIN: NEGATIVE mg/dL
Specific Gravity, Urine: 1.02 (ref 1.005–1.030)

## 2015-10-07 LAB — RAPID URINE DRUG SCREEN, HOSP PERFORMED
Amphetamines: NOT DETECTED
BARBITURATES: NOT DETECTED
BENZODIAZEPINES: NOT DETECTED
Cocaine: NOT DETECTED
Opiates: NOT DETECTED
Tetrahydrocannabinol: POSITIVE — AB

## 2015-10-07 LAB — ETHANOL: ALCOHOL ETHYL (B): 7 mg/dL — AB (ref <5)

## 2015-10-07 LAB — TROPONIN I: Troponin I: <0.03 ng/mL (ref <0.031)

## 2015-10-07 MED ORDER — METOCLOPRAMIDE HCL 5 MG/ML IJ SOLN
10.0000 mg | Freq: Once | INTRAMUSCULAR | Status: AC
  Administered 2015-10-07: 10 mg via INTRAVENOUS
  Filled 2015-10-07: qty 2

## 2015-10-07 MED ORDER — SODIUM CHLORIDE 0.9 % IV SOLN
INTRAVENOUS | Status: DC
  Administered 2015-10-07: 05:00:00 via INTRAVENOUS

## 2015-10-07 MED ORDER — DIPHENHYDRAMINE HCL 50 MG/ML IJ SOLN
25.0000 mg | Freq: Once | INTRAMUSCULAR | Status: AC
  Administered 2015-10-07: 25 mg via INTRAVENOUS
  Filled 2015-10-07: qty 1

## 2015-10-07 NOTE — Discharge Instructions (Signed)
Recheck as needed. Your tests tonight look good, just the chronic changes in your head CT scan from your old stroke. You EKG is normal and your troponin, the heart attack blood test is normal.

## 2015-10-07 NOTE — ED Notes (Signed)
Pt states understanding of care given and follow up instructions.  Ambulated from ED  

## 2015-10-07 NOTE — ED Provider Notes (Signed)
CSN: [id number]     Arrival date & time 10/07/15  0257 History   First MD Initiated Contact with Patient 10/07/15 [phone]    Chief Complaint  Patient presents with  . Chest Pain     (Consider location/radiation/quality/duration/timing/severity/associated sxs/prior Treatment) HPI patient is here with his wife who gives most of the history. She reports he woke her up about 2:30 this morning stating he got acutely short of breath when he got up to get a drink of water. He also states his chest felt height. Patient currently states however his chest discomfort is gone. He denies cough but has had nausea without vomiting, he denies diarrhea. He also describes the pain as "compressed" but denies feeling like he has a weight on his chest. Patient had a stroke last year from a blood clot only spurring per his wife and had left-sided weakness and sometimes has to use a cane. Since then he has been having headaches although he hadn't had a headache recently. He states now he is having a frontal throbbing headache without visual changes and he denies light and noise sensitivity. Recently by his doctor and was given a prescription for new blood pressure medicine which he has not started to take yet.   PCP Dr Olena Leatherwood in [place]  Past Medical History  Diagnosis Date  . Lumbar disc disease   . Stroke [facility])    Past Surgical History  Procedure Laterality Date  . Tee without cardioversion N/A 12/04/2014    Procedure: TRANSESOPHAGEAL ECHOCARDIOGRAM (TEE);  Surgeon: Laqueta Linden, MD;  Location: [facility];  Service: Cardiology;  Laterality: N/A;   Family History  Problem Relation Age of Onset  . Diabetes Maternal Grandmother   . Hypertension Sister    Social History  Substance Use Topics  . Smoking status: Current Every Day Smoker -- 0.50 packs/day    Types: Cigarettes  . Smokeless tobacco: None  . Alcohol Use: 0.0 oz/week    0 Standard drinks or equivalent per week     Comment: beer daily  lives  at home Lives with spouse  Review of Systems  All other systems reviewed and are negative.     Allergies  Review of patient's allergies indicates no known allergies.  Home Medications   Prior to Admission medications   Medication Sig Start Date End Date Taking? Authorizing Provider  aspirin 81 MG tablet Take 1 tablet (81 mg total) by mouth daily. 11/30/14   Standley Brooking, MD  escitalopram (LEXAPRO) 10 MG tablet Take 1 tablet (10 mg total) by mouth daily. 04/15/15   Drema Dallas, DO  HYDROcodone-acetaminophen (NORCO/VICODIN) 5-325 MG per tablet Take 1 tablet by mouth every 6 (six) hours as needed for moderate pain. Patient taking differently: Take 0.5 tablets by mouth every 6 (six) hours as needed for moderate pain.  11/30/14   Standley Brooking, MD  lovastatin (MEVACOR) 20 MG tablet Take 1 tablet (20 mg total) by mouth at bedtime. 11/30/14   Standley Brooking, MD   BP 107/63 mmHg  Pulse 56  Temp(Src) 98.2 F (36.8 C)  Resp 13  Ht 6' (1.829 m)  Wt 155 lb (70.308 kg)  BMI 21.02 kg/m2  SpO2 99%  Vital signs normal except bradycardia  Physical Exam  Constitutional: He is oriented to person, place, and time. He appears well-developed and well-nourished.  Non-toxic appearance. He does not appear ill. No distress.  Sleeping, upset about being awakened to discuss his ED visit.  HENT:  Head: Normocephalic and atraumatic.  Right Ear: External ear normal.  Left Ear: External ear normal.  Nose: Nose normal. No mucosal edema or rhinorrhea.  Mouth/Throat: Oropharynx is clear and moist and mucous membranes are normal. No dental abscesses or uvula swelling.  Eyes: Conjunctivae and EOM are normal. Pupils are equal, round, and reactive to light.  Neck: Normal range of motion and full passive range of motion without pain. Neck supple.  Cardiovascular: Normal rate, regular rhythm and normal heart sounds.  Exam reveals no gallop and no friction rub.   No murmur heard. Pulmonary/Chest: Effort  normal and breath sounds normal. No respiratory distress. He has no wheezes. He has no rhonchi. He has no rales. He exhibits no tenderness and no crepitus.  Abdominal: Soft. Normal appearance and bowel sounds are normal. He exhibits no distension. There is no tenderness. There is no rebound and no guarding.  Musculoskeletal: Normal range of motion. He exhibits no edema or tenderness.  Moves all extremities well.   Neurological: He is alert and oriented to person, place, and time. He has normal strength. No cranial nerve deficit.  Skin: Skin is warm, dry and intact. No rash noted. No erythema. No pallor.  Psychiatric: He has a normal mood and affect. His speech is normal and behavior is normal. His mood appears not anxious.  Nursing note and vitals reviewed.   ED Course  Procedures (including critical care time)  Medications  0.9 %  sodium chloride infusion ( Intravenous New Bag/Given 10/07/15 0504)  metoCLOPramide (REGLAN) injection 10 mg (10 mg Intravenous Given 10/07/15 0503)  diphenhydrAMINE (BENADRYL) injection 25 mg (25 mg Intravenous Given 10/07/15 0504)   Patient's chest pain is gone. He was given IV Reglan and Benadryl for his headache. He states his chest pain is gone, cannot tell me how long it lasted.   Recheck at 0540 am pt and wife sleeping in stretcher, states headache is gone.    Labs Review Results for orders placed or performed during the hospital encounter of 10/07/15  Comprehensive metabolic panel  Result Value Ref Range   Sodium 138 135 - 145 mmol/L   Potassium 4.2 3.5 - 5.1 mmol/L   Chloride 104 101 - 111 mmol/L   CO2 27 22 - 32 mmol/L   Glucose, Bld 87 65 - 99 mg/dL   BUN 12 6 - 20 mg/dL   Creatinine, Ser 1.47 0.61 - 1.24 mg/dL   Calcium 9.4 8.9 - 82.9 mg/dL   Total Protein 6.9 6.5 - 8.1 g/dL   Albumin 4.2 3.5 - 5.0 g/dL   AST 26 15 - 41 U/L   ALT 10 (L) 17 - 63 U/L   Alkaline Phosphatase 53 38 - 126 U/L   Total Bilirubin 0.6 0.3 - 1.2 mg/dL   GFR calc non Af  Amer >60 >60 mL/min   GFR calc Af Amer >60 >60 mL/min   Anion gap 7 5 - 15  Ethanol  Result Value Ref Range   Alcohol, Ethyl (B) 7 (H) <5 mg/dL  CBC with Differential  Result Value Ref Range   WBC 4.6 4.0 - 10.5 K/uL   RBC 4.39 4.22 - 5.81 MIL/uL   Hemoglobin 13.5 13.0 - 17.0 g/dL   HCT 56.2 13.0 - 86.5 %   MCV 92.3 78.0 - 100.0 fL   MCH 30.8 26.0 - 34.0 pg   MCHC 33.3 30.0 - 36.0 g/dL   RDW 78.4 69.6 - 29.5 %   Platelets 165 150 - 400 K/uL  Neutrophils Relative % 44 %   Neutro Abs 2.0 1.7 - 7.7 K/uL   Lymphocytes Relative 47 %   Lymphs Abs 2.2 0.7 - 4.0 K/uL   Monocytes Relative 8 %   Monocytes Absolute 0.4 0.1 - 1.0 K/uL   Eosinophils Relative 1 %   Eosinophils Absolute 0.1 0.0 - 0.7 K/uL   Basophils Relative 0 %   Basophils Absolute 0.0 0.0 - 0.1 K/uL  Urinalysis, Routine w reflex microscopic  Result Value Ref Range   Color, Urine YELLOW YELLOW   APPearance CLEAR CLEAR   Specific Gravity, Urine 1.020 1.005 - 1.030   pH 6.0 5.0 - 8.0   Glucose, UA NEGATIVE NEGATIVE mg/dL   Hgb urine dipstick NEGATIVE NEGATIVE   Bilirubin Urine NEGATIVE NEGATIVE   Ketones, ur NEGATIVE NEGATIVE mg/dL   Protein, ur NEGATIVE NEGATIVE mg/dL   Nitrite NEGATIVE NEGATIVE   Leukocytes, UA NEGATIVE NEGATIVE  Urine rapid drug screen (hosp performed)  Result Value Ref Range   Opiates NONE DETECTED NONE DETECTED   Cocaine NONE DETECTED NONE DETECTED   Benzodiazepines NONE DETECTED NONE DETECTED   Amphetamines NONE DETECTED NONE DETECTED   Tetrahydrocannabinol POSITIVE (A) NONE DETECTED   Barbiturates NONE DETECTED NONE DETECTED  Troponin I  Result Value Ref Range   Troponin I <0.03 <0.031 ng/mL   Laboratory interpretation all normal except positive UDS     Imaging Review Ct Head Wo Contrast  10/07/2015  CLINICAL DATA:  Acute onset of headache.  Initial encounter. EXAM: CT HEAD WITHOUT CONTRAST TECHNIQUE: Contiguous axial images were obtained from the base of the skull through the  vertex without intravenous contrast. COMPARISON:  CT of the head performed 01/30/2015 FINDINGS: There is no evidence of acute infarction, mass lesion, or intra- or extra-axial hemorrhage on CT. A chronic right MCA territory infarct is again noted, with associated encephalomalacia and ex vacuo dilatation of the right lateral ventricle. This involves portions of the right basal ganglia. The brainstem and fourth ventricle are within normal limits. No mass effect or midline shift is seen. There is no evidence of fracture; visualized osseous structures are unremarkable in appearance. The visualized portions of the orbits are within normal limits. The paranasal sinuses and mastoid air cells are well-aerated. No significant soft tissue abnormalities are seen. IMPRESSION: 1. No acute intracranial pathology seen on CT. 2. Chronic right MCA territory infarct again noted, with associated encephalomalacia and ex vacuo dilatation of the right lateral ventricle. Electronically Signed   By: Roanna Raider M.D.   On: 10/07/2015 05:00   I have personally reviewed and evaluated these images and lab results as part of my medical decision-making.   EKG Interpretation   Date/Time:  Tuesday October 07 2015 03:04:40 EST Ventricular Rate:  59 PR Interval:  165 QRS Duration: 87 QT Interval:  417 QTC Calculation: 413 R Axis:   86 Text Interpretation:  Sinus rhythm Biatrial enlargement ST elevation  suggests acute pericarditis No significant change since last tracing 26 Apr 2015 Confirmed by Susane Bey  MD-I, Bernedette Auston ([id number]) on 10/07/2015 4:09:05 AM      MDM   Final diagnoses:  Headache, unspecified headache type  Atypical chest pain    Plan discharge  Devoria Albe, MD, Concha Pyo, MD 10/07/15 [phone]

## 2015-10-07 NOTE — ED Notes (Signed)
Pt c/o central chest pain that started x 1 hour ago and pt describes the pain as a tightness in his chest

## 2015-10-16 ENCOUNTER — Ambulatory Visit: Payer: Self-pay | Admitting: Neurology

## 2015-10-20 ENCOUNTER — Ambulatory Visit: Payer: Self-pay | Admitting: Neurology

## 2016-02-13 ENCOUNTER — Ambulatory Visit (INDEPENDENT_AMBULATORY_CARE_PROVIDER_SITE_OTHER): Payer: PRIVATE HEALTH INSURANCE | Admitting: Neurology

## 2016-02-13 ENCOUNTER — Encounter: Payer: Self-pay | Admitting: Neurology

## 2016-02-13 VITALS — BP 166/82 | HR 72 | Ht 72.0 in | Wt 154.0 lb

## 2016-02-13 DIAGNOSIS — G43109 Migraine with aura, not intractable, without status migrainosus: Secondary | ICD-10-CM

## 2016-02-13 DIAGNOSIS — I63411 Cerebral infarction due to embolism of right middle cerebral artery: Secondary | ICD-10-CM | POA: Diagnosis not present

## 2016-02-13 DIAGNOSIS — E785 Hyperlipidemia, unspecified: Secondary | ICD-10-CM

## 2016-02-13 DIAGNOSIS — F32A Depression, unspecified: Secondary | ICD-10-CM

## 2016-02-13 DIAGNOSIS — I69398 Other sequelae of cerebral infarction: Secondary | ICD-10-CM | POA: Diagnosis not present

## 2016-02-13 DIAGNOSIS — G43709 Chronic migraine without aura, not intractable, without status migrainosus: Secondary | ICD-10-CM

## 2016-02-13 DIAGNOSIS — Z720 Tobacco use: Secondary | ICD-10-CM

## 2016-02-13 DIAGNOSIS — R569 Unspecified convulsions: Secondary | ICD-10-CM

## 2016-02-13 DIAGNOSIS — F329 Major depressive disorder, single episode, unspecified: Secondary | ICD-10-CM

## 2016-02-13 DIAGNOSIS — I10 Essential (primary) hypertension: Secondary | ICD-10-CM

## 2016-02-13 DIAGNOSIS — G43809 Other migraine, not intractable, without status migrainosus: Secondary | ICD-10-CM

## 2016-02-13 MED ORDER — VENLAFAXINE HCL ER 37.5 MG PO CP24: ORAL_CAPSULE | ORAL | Status: AC

## 2016-02-13 NOTE — Patient Instructions (Addendum)
1.  Continue aspirin 81mg  daily 2.  Must restart your blood pressure medication.  Call your PCP office today. 3.  Check lipid panel.  Need to restart cholesterol medication 4.  Continue Keppra 1000mg  twice daily.  No driving. 5.  Quit smoking 6.  Will send for neuropsychological testing regarding memory.  Follow up afterwards. 7.  Stop Lexapro.  Instead, start venlafaxine XR 37.5mg  daily for 7 days, then increase to 75mg  daily.  This will treat both depression and migraine headaches.

## 2016-02-13 NOTE — Progress Notes (Signed)
Chart forwarded.  

## 2016-02-13 NOTE — Progress Notes (Signed)
NEUROLOGY FOLLOW UP OFFICE NOTE  Fred RedoOwen Bell [id number]  HISTORY OF PRESENT ILLNESS: Fred Bell is a 55 year old right-handed male with tobacco dependence and recent stroke who follows up for stroke and headache.  History supplemented by his wife.  UPDATE: About a month ago, he had a seizure.  His left arm became more numb, like when he had the stroke, and then his head turned to the left and he started convulsing.  [facility]  He had another seizure in the CT scanner.  He was started on Keppra 1000mg  twice daily.  He has not had any recurrent spells.  He continue to have headache.  They are associated with nausea.  They occur every other day.  Twice a month, he experiences episodes of visual disturbance, described as swirling colors, lasting 30 minutes.  He has not been taking his prescribed antihypertensive and statin medication due to cost, however he recently got insurance again.  He reports depression as he is unable to work and has been having financial issues.  He reports short-term memory issues.  HISTORY: He had an acute right MCA territory stroke in March 2016, presenting as left sided facial weakness, left upper and lower extremity weakness, and dysphagia.  [facility] underwent a stroke workup.  Stroke was diagnosed by MRI of brain.  TTE, TEE and carotid ultrasound were unremarkable.  Telemetry revealed no atrial fibrillation.  Hgb A1c was 6.2.  LDL was 81.  He was started on ASA 81mg  and lovastatin 20mg .  He had an outpatient TEE, which showed no cardiac source of emboli.  Hypercoagulable panel, including Sed Rate, homocysteine, antiphospholipid antibody, cardiolipin antibody, protein C & S, were negative.  He received home PT/OT.  At baseline, he still has some weakness on the left side.  His left arm "feels funny."  He sometimes drags his left leg.  He also exhibits apraxia.  He sometimes puts his clothes on backwards.  He  sometimes is unable to figure out how to put things together. For example, he took apart his compressor and was unable to put it together again.  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  He is also more irritable and depressed regarding his current situation.  Since the stroke, he has had headache.  They are a severe throbbing, holocephalic pain without associated symptoms.  They last all day and occur about once a month.  He presented to the ED on 01/30/15, where he received a migraine cocktail and discharged with hydrocodone.  He takes tylenol for it.  PAST MEDICAL HISTORY: Past Medical History  Diagnosis Date  . Lumbar disc disease   . Stroke [facility](HCC)     MEDICATIONS: Current Outpatient Prescriptions on File Prior to Visit  Medication Sig Dispense Refill  . aspirin 81 MG tablet Take 1 tablet (81 mg total) by mouth daily.    Marland Kitchen. escitalopram (LEXAPRO) 10 MG tablet Take 1 tablet (10 mg total) by mouth daily. 30 tablet 6  . lovastatin (MEVACOR) 20 MG tablet Take 1 tablet (20 mg total) by mouth at bedtime. (Patient not taking: Reported on 02/13/2016) 30 tablet 0   No current facility-administered medications on file prior to visit.    ALLERGIES: No Known Allergies  FAMILY HISTORY: Family History  Problem Relation Age of Onset  . Diabetes Maternal Grandmother   . Hypertension Sister     SOCIAL HISTORY: Social History  Social History  . Marital Status: Married    Spouse Name: N/A  . Number of Children: N/A  . Years of Education: N/A   Occupational History  . Not on file.   Social History Main Topics  . Smoking status: Current Every Day Smoker -- 0.50 packs/day    Types: Cigarettes  . Smokeless tobacco: Not on file  . Alcohol Use: 0.0 oz/week    0 Standard drinks or equivalent per week     Comment: beer daily  . Drug Use: Yes    Special: Marijuana     Comment: denies  . Sexual Activity:    Partners: Female   Other Topics Concern  .  Not on file   Social History Narrative    REVIEW OF SYSTEMS: Constitutional: No fevers, chills, or sweats, no generalized fatigue, change in appetite Eyes: No visual changes, double vision, eye pain Ear, nose and throat: No hearing loss, ear pain, nasal congestion, sore throat Cardiovascular: No chest pain, palpitations Respiratory:  No shortness of breath at rest or with exertion, wheezes GastrointestinaI: No nausea, vomiting, diarrhea, abdominal pain, fecal incontinence Genitourinary:  No dysuria, urinary retention or frequency Musculoskeletal:  No neck pain, back pain Integumentary: No rash, pruritus, skin lesions Neurological: as above Psychiatric: No depression, insomnia, anxiety Endocrine: No palpitations, fatigue, diaphoresis, mood swings, change in appetite, change in weight, increased thirst Hematologic/Lymphatic:  No purpura, petechiae. Allergic/Immunologic: no itchy/runny eyes, nasal congestion, recent allergic reactions, rashes  PHYSICAL EXAM: Filed Vitals:   02/13/16 0907  BP: 166/82  Pulse: 72   General: No acute distress.  Patient appears well-groomed.  normal body habitus. Head:  Normocephalic/atraumatic Eyes:  Fundi examined but not visualized Neck: supple, no paraspinal tenderness, full range of motion Heart:  Regular rate and rhythm Lungs:  Clear to auscultation bilaterally Back: No paraspinal tenderness Neurological Exam: alert and oriented to person, place, and time. Attention span and concentration intact, recent and remote memory intact, fund of knowledge intact.  Speech fluent and not dysarthric, language intact.  CN II-XII intact. Bulk and tone normal, muscle strength 5/5 throughout (grip, triceps and hip flexion strength required encouragement but was full).  Sensation to light touch, temperature and vibration reduced in left upper and lower extremities.  Deep tendon reflexes 3+ in left patellar but otherwise 2+ throughout, toes downgoing.  Finger to nose  and heel to shin testing intact.  Gait a little wide-based, Romberg negative.  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  Hemiparesis has improved. 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  Continue Keppra  twice daily.  He does not drive 2.  ASA  daily 3.  Must contact PCP office today to get BP medication, must be treated immediately. 4.  Check lipid panel.  Restart statin pending results of LDL (goal should be less than 70). 5.  Stop escitalopram and instead start venlafaxine XR 37.5mg  daily for 7 days, then increase to  daily (to treat depression and as migraine prevention) 6.  Smoking cessation 7.  Refer for neuropsychological testing to assess memory problems and apraxia.  Refused to perform MoCA today. 8.  Will get notes from [place].  30 minutes spent face to face with patient, 50% spent discussing diagnosis and management.  Shon Millet, DO  CC: Lia Hopping, MD

## 2016-03-04 ENCOUNTER — Encounter: Payer: PRIVATE HEALTH INSURANCE | Admitting: Psychology

## 2016-03-04 DIAGNOSIS — Z029 Encounter for administrative examinations, unspecified: Secondary | ICD-10-CM

## 2016-03-16 ENCOUNTER — Encounter: Payer: PRIVATE HEALTH INSURANCE | Admitting: Psychology

## 2016-03-24 ENCOUNTER — Ambulatory Visit: Payer: PRIVATE HEALTH INSURANCE | Admitting: Neurology

## 2016-04-01 ENCOUNTER — Encounter: Payer: Self-pay | Admitting: Psychology

## 2016-04-01 ENCOUNTER — Ambulatory Visit (INDEPENDENT_AMBULATORY_CARE_PROVIDER_SITE_OTHER): Payer: PRIVATE HEALTH INSURANCE | Admitting: Psychology

## 2016-04-01 DIAGNOSIS — F329 Major depressive disorder, single episode, unspecified: Secondary | ICD-10-CM

## 2016-04-01 DIAGNOSIS — F1099 Alcohol use, unspecified with unspecified alcohol-induced disorder: Secondary | ICD-10-CM | POA: Diagnosis not present

## 2016-04-01 DIAGNOSIS — IMO0002 Reserved for concepts with insufficient information to code with codable children: Secondary | ICD-10-CM

## 2016-04-01 DIAGNOSIS — R413 Other amnesia: Secondary | ICD-10-CM

## 2016-04-01 DIAGNOSIS — I63411 Cerebral infarction due to embolism of right middle cerebral artery: Secondary | ICD-10-CM

## 2016-04-01 DIAGNOSIS — F32A Depression, unspecified: Secondary | ICD-10-CM
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
## 2016-04-13 ENCOUNTER — Encounter: Payer: PRIVATE HEALTH INSURANCE | Admitting: Psychology

## 2016-04-20 ENCOUNTER — Ambulatory Visit: Payer: PRIVATE HEALTH INSURANCE | Admitting: Neurology

## 2016-05-04 ENCOUNTER — Ambulatory Visit: Payer: PRIVATE HEALTH INSURANCE | Admitting: Psychiatry

## 2016-05-11 ENCOUNTER — Emergency Department (HOSPITAL_COMMUNITY)
Admission: EM | Admit: 2016-05-11 | Discharge: 2016-05-11 | Disposition: A | Discharge status: Home / Self Care | Payer: PRIVATE HEALTH INSURANCE | Attending: Emergency Medicine | Admitting: Emergency Medicine

## 2016-05-11 ENCOUNTER — Encounter (HOSPITAL_COMMUNITY): Payer: Self-pay | Admitting: Emergency Medicine

## 2016-05-11 DIAGNOSIS — Z7982 Long term (current) use of aspirin: Secondary | ICD-10-CM | POA: Diagnosis not present

## 2016-05-11 DIAGNOSIS — R569 Unspecified convulsions: Secondary | ICD-10-CM | POA: Insufficient documentation

## 2016-05-11 DIAGNOSIS — Z79899 Other long term (current) drug therapy: Secondary | ICD-10-CM | POA: Diagnosis not present

## 2016-05-11 DIAGNOSIS — F1010 Alcohol abuse, uncomplicated: Secondary | ICD-10-CM | POA: Insufficient documentation

## 2016-05-11 DIAGNOSIS — R55 Syncope and collapse: Secondary | ICD-10-CM | POA: Diagnosis not present

## 2016-05-11 DIAGNOSIS — F17210 Nicotine dependence, cigarettes, uncomplicated: Secondary | ICD-10-CM | POA: Diagnosis not present

## 2016-05-11 LAB — CBC WITH DIFFERENTIAL/PLATELET
Basophils Absolute: 0 10*3/uL (ref 0.0–0.1)
Basophils Relative: 1 %
EOS ABS: 0 10*3/uL (ref 0.0–0.7)
Eosinophils Relative: 1 %
HCT: 40.5 % (ref 39.0–52.0)
HEMOGLOBIN: 13.4 g/dL (ref 13.0–17.0)
LYMPHS ABS: 2 10*3/uL (ref 0.7–4.0)
LYMPHS PCT: 44 %
MCH: 30.5 pg (ref 26.0–34.0)
MCHC: 33.1 g/dL (ref 30.0–36.0)
MCV: 92.3 fL (ref 78.0–100.0)
Monocytes Absolute: 0.4 10*3/uL (ref 0.1–1.0)
Monocytes Relative: 10 %
NEUTROS ABS: 2 10*3/uL (ref 1.7–7.7)
NEUTROS PCT: 44 %
Platelets: 157 10*3/uL (ref 150–400)
RBC: 4.39 MIL/uL (ref 4.22–5.81)
RDW: 13.3 % (ref 11.5–15.5)
WBC: 4.4 10*3/uL (ref 4.0–10.5)

## 2016-05-11 LAB — COMPREHENSIVE METABOLIC PANEL
ALK PHOS: 61 U/L (ref 38–126)
ALT: 10 U/L — AB (ref 17–63)
AST: 28 U/L (ref 15–41)
Albumin: 4.4 g/dL (ref 3.5–5.0)
Anion gap: 9 (ref 5–15)
BUN: 6 mg/dL (ref 6–20)
CALCIUM: 9 mg/dL (ref 8.9–10.3)
CO2: 30 mmol/L (ref 22–32)
CREATININE: 0.91 mg/dL (ref 0.61–1.24)
Chloride: 102 mmol/L (ref 101–111)
GFR calc non Af Amer: >60 mL/min (ref >60)
GLUCOSE: 96 mg/dL (ref 65–99)
Potassium: 3.9 mmol/L (ref 3.5–5.1)
SODIUM: 141 mmol/L (ref 135–145)
Total Bilirubin: 0.3 mg/dL (ref 0.3–1.2)
Total Protein: 7.1 g/dL (ref 6.5–8.1)

## 2016-05-11 LAB — ETHANOL: Alcohol, Ethyl (B): 203 mg/dL — ABNORMAL HIGH (ref <5)

## 2016-05-11 MED ORDER — LORAZEPAM 1 MG PO TABS
1.0000 mg | ORAL_TABLET | Freq: Once | ORAL | Status: AC
  Administered 2016-05-11: 1 mg via ORAL
  Filled 2016-05-11: qty 1

## 2016-05-11 MED ORDER — LORAZEPAM 2 MG/ML IJ SOLN: 1.0000 mg | Freq: Once | INTRAMUSCULAR | Status: DC

## 2016-05-11 NOTE — ED Provider Notes (Signed)
[facility]-EMERGENCY DEPT Provider Note   CSN: [id number] Arrival date & time: 05/11/16  1026     History   Chief Complaint Chief Complaint  Patient presents with  . Seizures    HPI Fred Bell is a 55 y.o. male.  The history is provided by the patient. No language interpreter was used.  Seizures   This is a new problem. The current episode started more than 1 week ago. The problem has been gradually improving. There was 1 seizure. Pertinent negatives include no sleepiness and no muscle weakness. Characteristics include loss of consciousness. The seizure(s) had no focality. There has been no fever.   Pt admits to drinking heavily last pm.   Pt had a seizure today.  Pt has a histroy of seizure and previous cva Past Medical History:  Diagnosis Date  . Lumbar disc disease   . Stroke [facility](HCC)     Patient Active Problem List   Diagnosis Date Noted  . Depression 04/15/2015  . Hemiplegia of nondominant side, late effect of cerebrovascular disease (HCC) 04/15/2015  . Stroke (HCC) 11/28/2014  . Acute right MCA stroke (HCC) 11/28/2014  . Left-sided weakness 11/28/2014  . Dysphagia 11/28/2014  . Tobacco abuse 11/28/2014    Past Surgical History:  Procedure Laterality Date  . TEE WITHOUT CARDIOVERSION N/A 12/04/2014   Procedure: TRANSESOPHAGEAL ECHOCARDIOGRAM (TEE);  Surgeon: Laqueta LindenSuresh A Koneswaran, MD;  Location: [facility] ENDO SUITE;  Service: Cardiology;  Laterality: N/A;       Home Medications    Prior to Admission medications   Medication Sig Start Date End Date Taking? Authorizing Provider  aspirin 81 MG tablet Take 1 tablet (81 mg total) by mouth daily. 11/30/14  Yes Standley Brookinganiel P Goodrich, MD  levETIRAcetam (KEPPRA) 1000 MG tablet Take 500 mg by mouth 2 (two) times daily.   Yes Historical Provider, MD  venlafaxine XR (EFFEXOR XR) 37.5 MG 24 hr capsule Take 1 capsule daily for 7 days, then increase to 2 capsules daily. 02/13/16  Yes Drema DallasAdam R Jaffe, DO    Family History Family History  Problem  Relation Age of Onset  . Diabetes Maternal Grandmother   . Hypertension Sister     Social History Social History  Substance Use Topics  . Smoking status: Current Every Day Smoker    Packs/day: 1.00    Types: Cigarettes  . Smokeless tobacco: Not on file  . Alcohol use 0.0 oz/week     Comment: multiple beers daily     Allergies   Review of patient's allergies indicates no known allergies.   Review of Systems Review of Systems  Neurological: Positive for seizures and loss of consciousness.  All other systems reviewed and are negative.    Physical Exam Updated Vital Signs BP 137/70   Pulse 71   Temp 98.5 F (36.9 C) (Oral)   Resp 23   Ht 6' (1.829 m)   Wt 90.7 kg   SpO2 97%   BMI 27.12 kg/m   Physical Exam  Constitutional: He appears well-developed and well-nourished.  HENT:  Head: Normocephalic and atraumatic.  Eyes: Conjunctivae are normal.  Neck: Neck supple.  Cardiovascular: Normal rate and regular rhythm.   No murmur heard. Pulmonary/Chest: Effort normal and breath sounds normal. No respiratory distress.  Abdominal: Soft. There is no tenderness.  Musculoskeletal: He exhibits no edema.  Neurological: He is alert.  Right sided weakness old   Skin: Skin is warm and dry.  Psychiatric: He has a normal mood and affect.  Nursing note and  vitals reviewed.    ED Treatments / Results  Labs (all labs ordered are listed, but only abnormal results are displayed) Labs Reviewed  COMPREHENSIVE METABOLIC PANEL - Abnormal; Notable for the following:       Result Value   ALT 10 (*)    All other components within normal limits  ETHANOL - Abnormal; Notable for the following:    Alcohol, Ethyl (B) 203 (*)    All other components within normal limits  CBC WITH DIFFERENTIAL/PLATELET    EKG  EKG Interpretation None       Radiology No results found.  Procedures Procedures (including critical care time)  Medications Ordered in ED Medications  LORazepam  (ATIVAN) tablet 1 mg (1 mg Oral Given 05/11/16 1213)     Initial Impression / Assessment and Plan / ED Course  I have reviewed the triage vital signs and the nursing notes.  Pertinent labs & imaging results that were available during my care of the patient were reviewed by me and considered in my medical decision making (see chart for details).  Clinical Course    Pt awake and alert.  He wants to go home.  Pt feels better.  I counseled pt on albohol as a trigger for seizures.  Final Clinical Impressions(s) / ED Diagnoses   Final diagnoses:  Seizure [facility])  Alcohol abuse    New Prescriptions New Prescriptions   No medications on file     Elson Areas, PA-C 05/11/16 1242    Mancel Bale, MD 05/11/16 1546

## 2016-05-11 NOTE — ED Triage Notes (Signed)
Per EMS: pt had seizure last night, unwitnessed.  PT has not had any seizures today.  ETOH on board per EMS. Pt alert and oriented.  144/66, 94pulse, 18rr, 100%, 144 cbg.

## 2016-05-11 NOTE — Discharge Instructions (Signed)
Avoid alcohol.  Take your medication a directed

## 2016-05-11 NOTE — ED Notes (Signed)
Called patient's wife. She states she will be at ED to pick patient up.

## 2017-01-05 IMAGING — CT CT HEAD W/O CM
1 of 2 series · 15 of 30 positions shown, 19 images · non-contrast
Comparison: Head CT and brain MRI 11/28/2014

CLINICAL DATA: Headache for 2 days.  Left arm pain.

EXAM:
CT HEAD WITHOUT CONTRAST
TECHNIQUE: Contiguous axial images were obtained from the base of the skull
through the vertex without intravenous contrast.

[Series 2: headtrauma 4.8 h37s · axial · 0.47mm/px · z∈[+83,+208]mm · 15 of 30 slices shown, 19 images]
[im 2/30  brain]
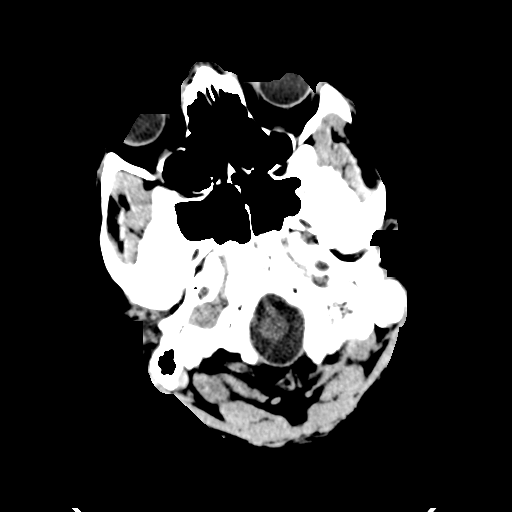
[im 2/30  bone]
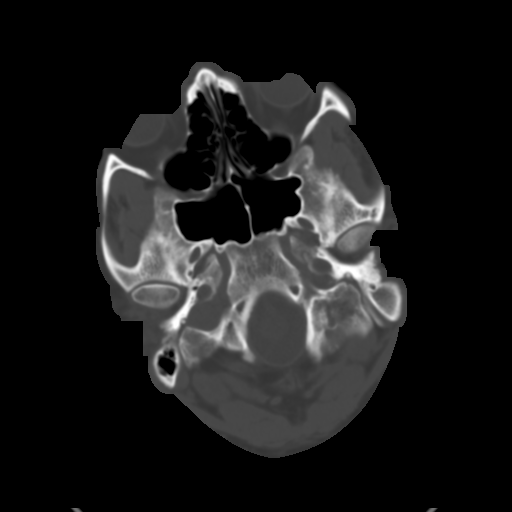
[im 4/30  brain]
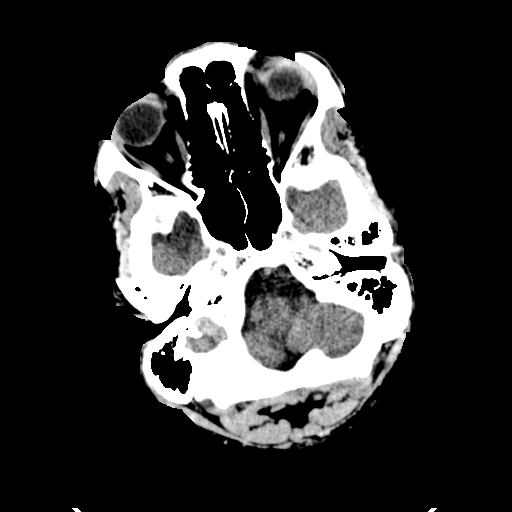
[im 6/30  brain]
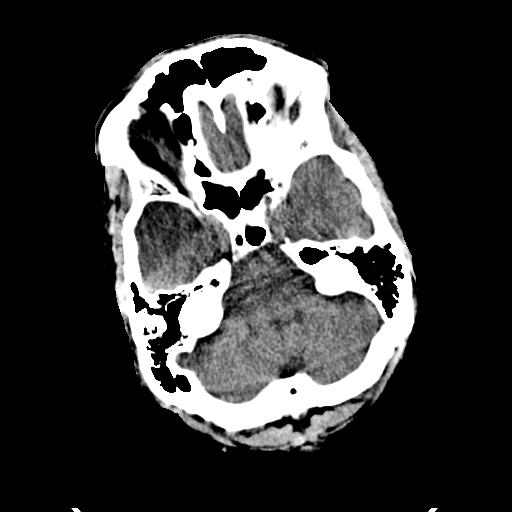
[im 8/30  brain]
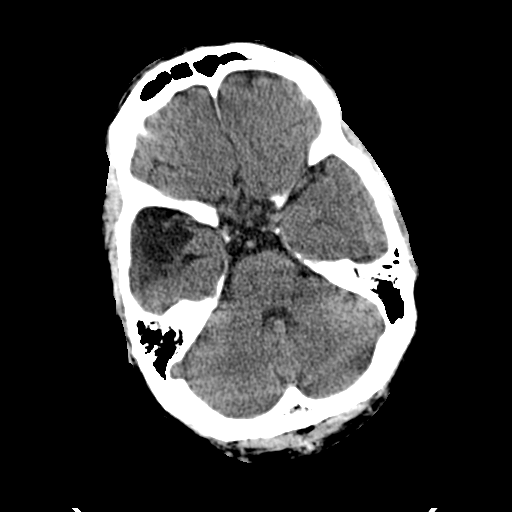
[im 10/30  brain]
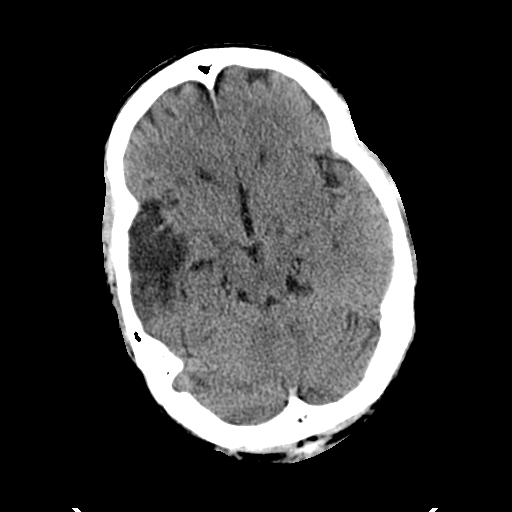
[im 10/30  bone]
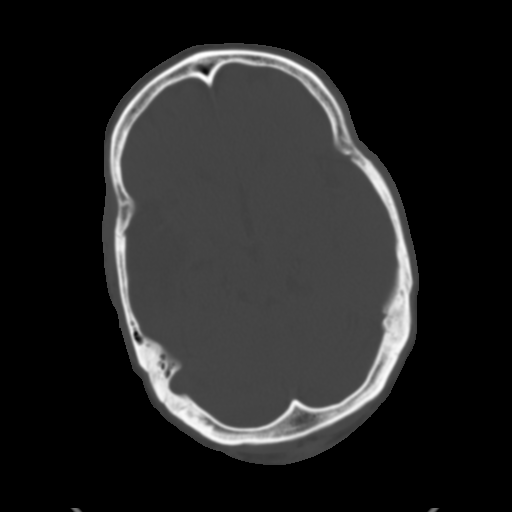
[im 11/30  brain]
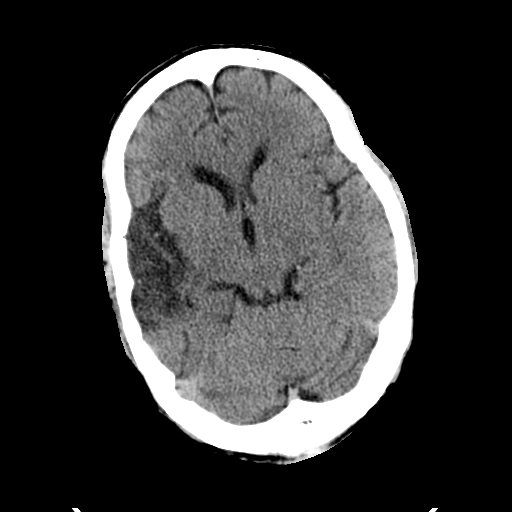
[im 13/30  brain]
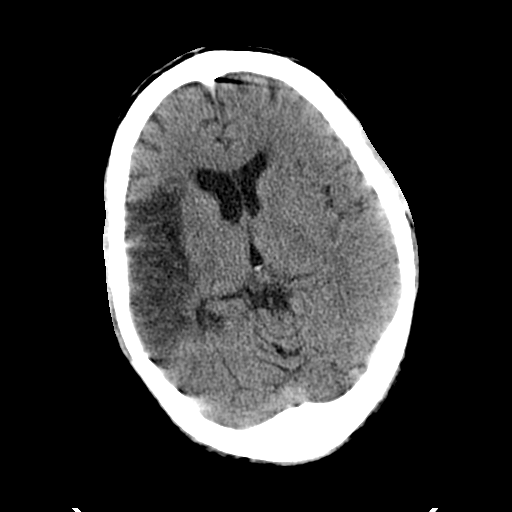
[im 15/30  brain]
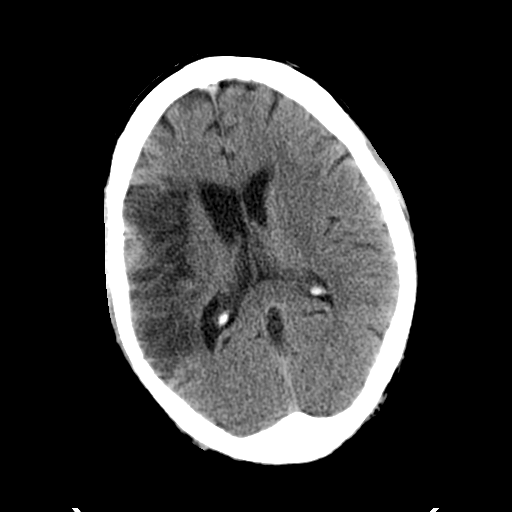
[im 17/30  brain]
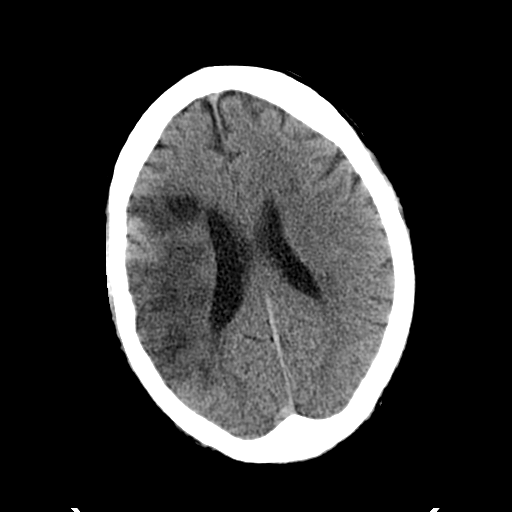
[im 17/30  bone]
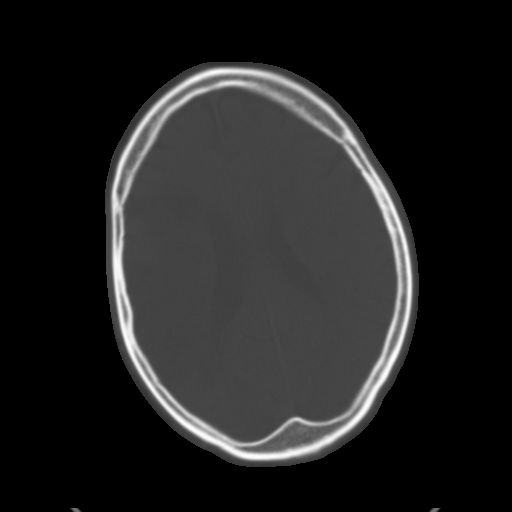
[im 19/30  brain]
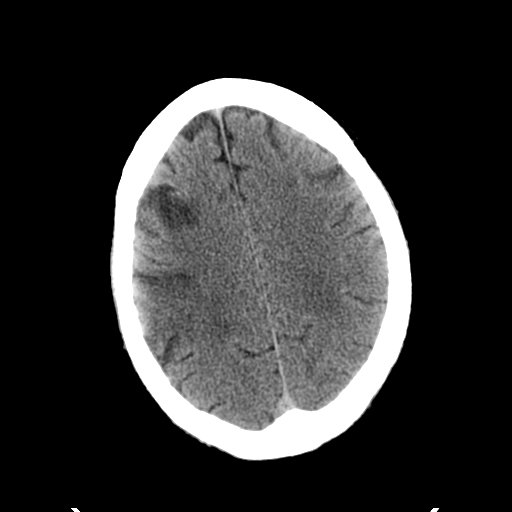
[im 20/30  brain]
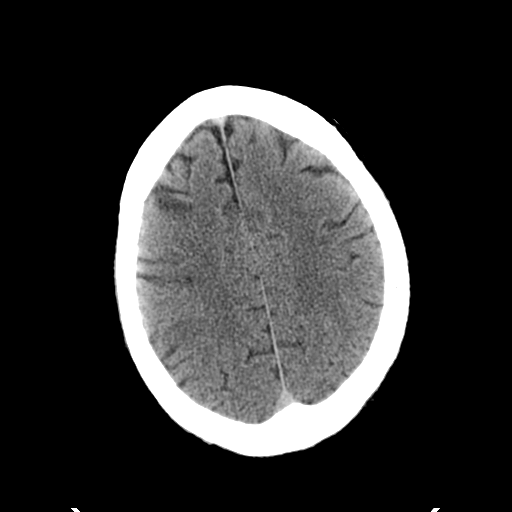
[im 22/30  brain]
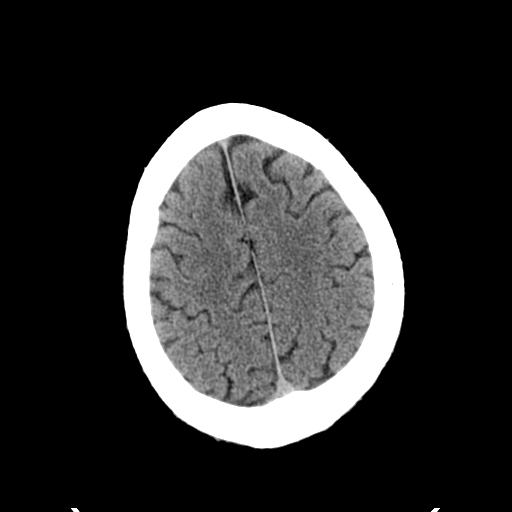
[im 24/30  brain]
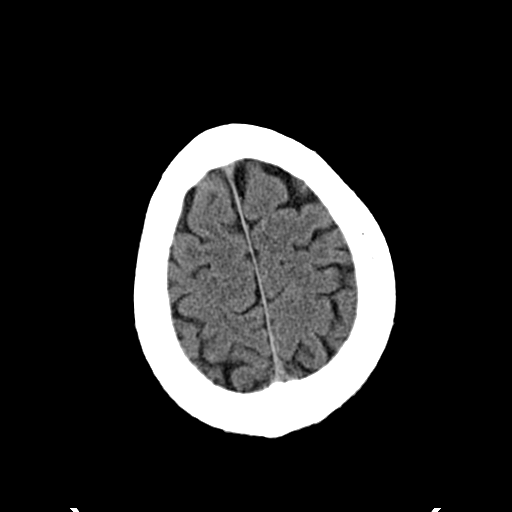
[im 24/30  bone]
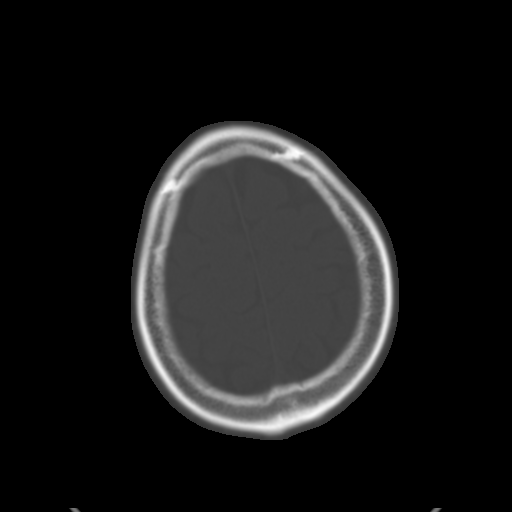
[im 26/30  brain]
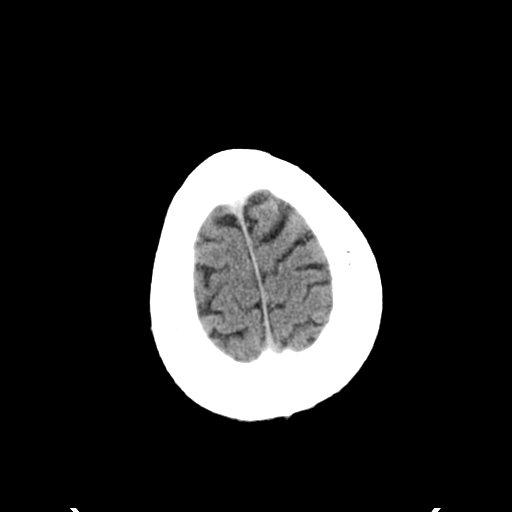
[im 28/30  brain]
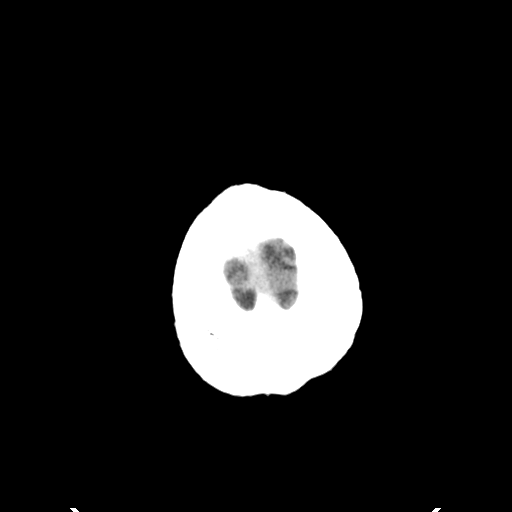

[15 of 30 positions shown; findings below may reference images not displayed]

FINDINGS: Expected evolution of right MCA distribution infarct from prior exam
with encephalomalacia. No evidence of acute ischemia. Mild ex vacuo
dilatation of the right lateral ventricle. No intracranial
hemorrhage. Mild chronic small vessel ischemic change. No subdural
or extra-axial fluid collection. No hydrocephalus. The calvarium is
intact. Included paranasal sinuses and mastoid air cells are well
aerated.
IMPRESSION: Expected evolution of right MCA distribution infarct from prior
exam. No acute intracranial abnormality is seen.

## 2017-09-12 IMAGING — CT CT HEAD W/O CM
1 of 2 series · 15 of 30 positions shown, 19 images · non-contrast
Comparison: CT of the head performed 01/30/2015

CLINICAL DATA: Acute onset of headache.  Initial encounter.

EXAM:
CT HEAD WITHOUT CONTRAST
TECHNIQUE: Contiguous axial images were obtained from the base of the skull
through the vertex without intravenous contrast.

[Series 3: headtrauma 2.4 h60s · axial · 0.53mm/px · z∈[+72,+230]mm · 15 of 72 slices shown, 19 images]
[im 4/72  brain]
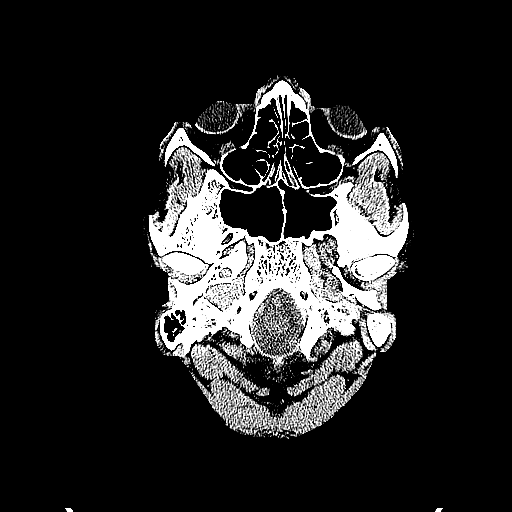
[im 4/72  bone]
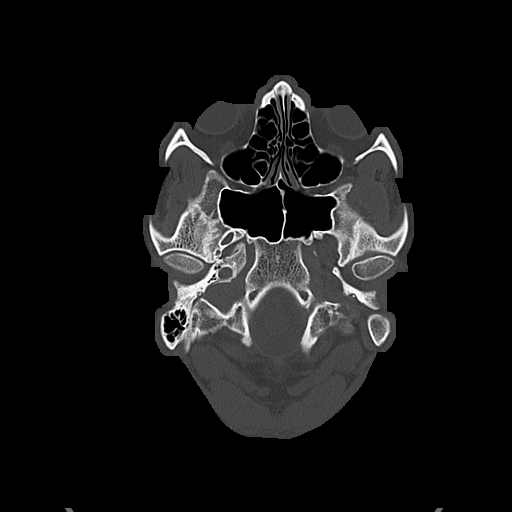
[im 8/72  brain]
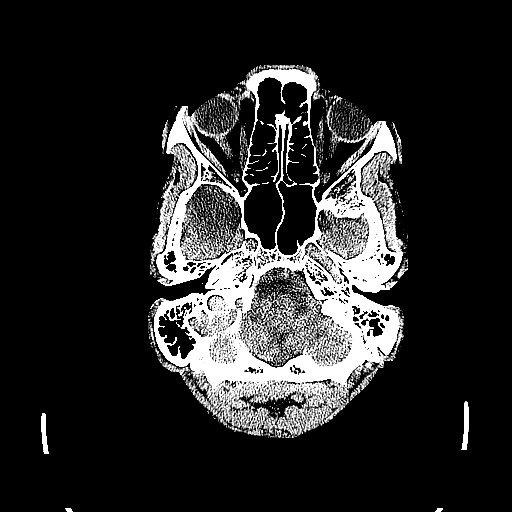
[im 15/72  brain]
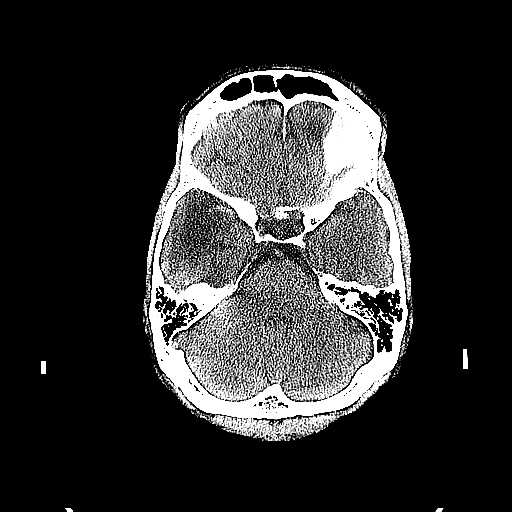
[im 19/72  brain]
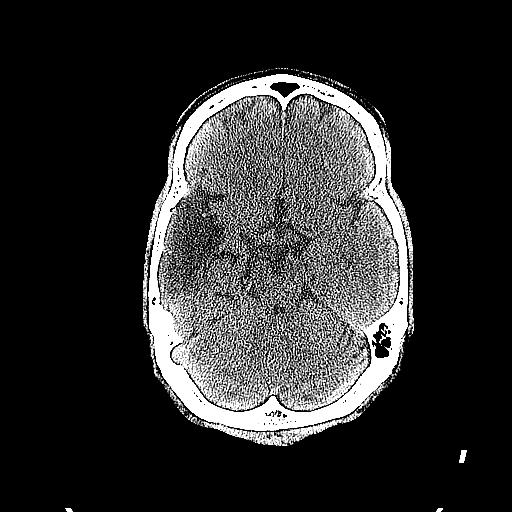
[im 23/72  brain]
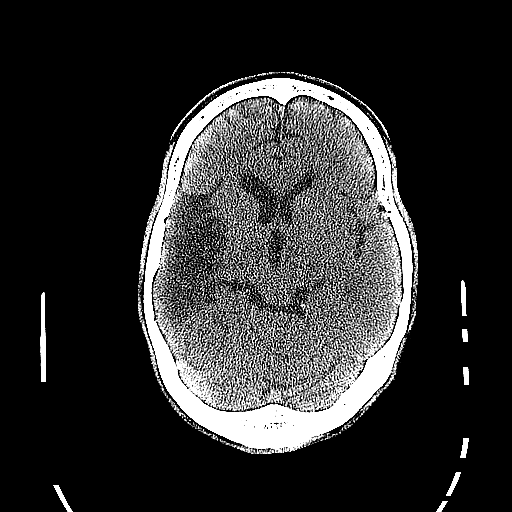
[im 23/72  bone]
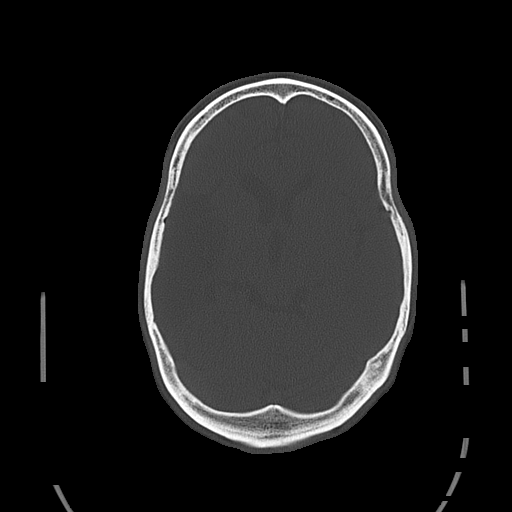
[im 27/72  brain]
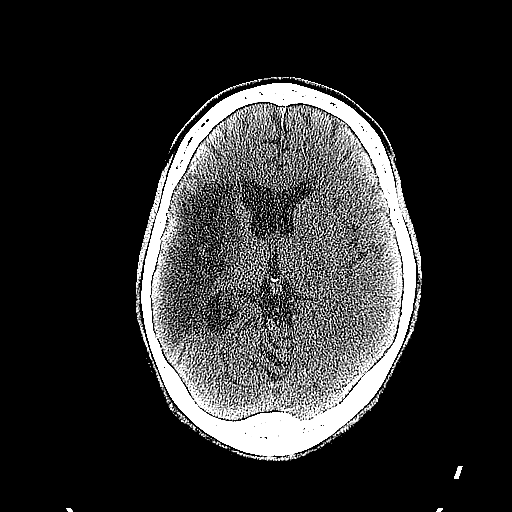
[im 30/72  brain]
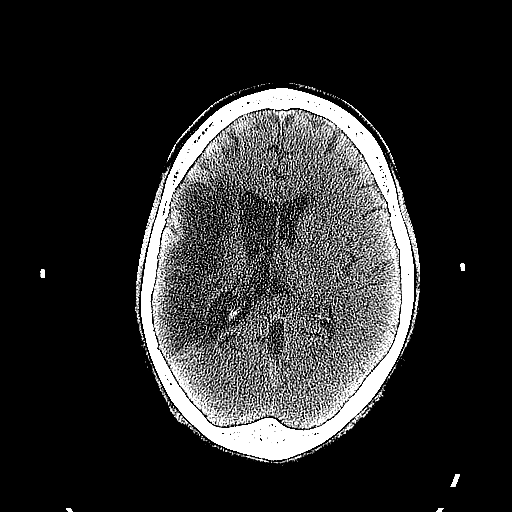
[im 38/72  brain]
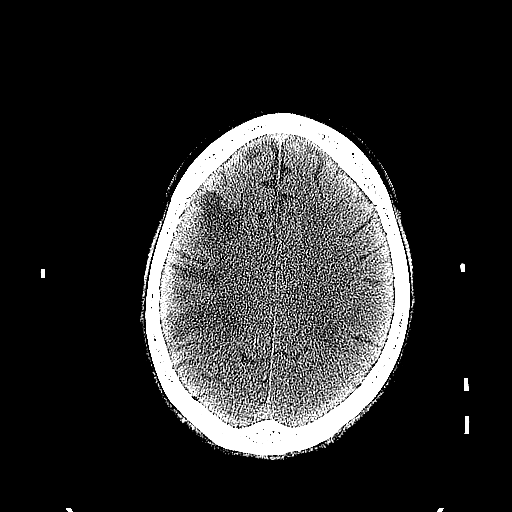
[im 42/72  brain]
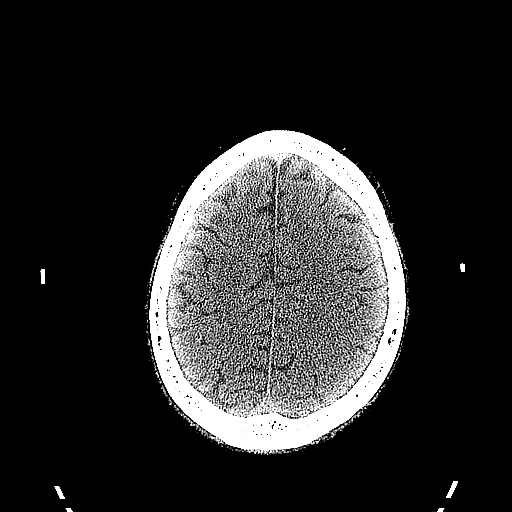
[im 42/72  bone]
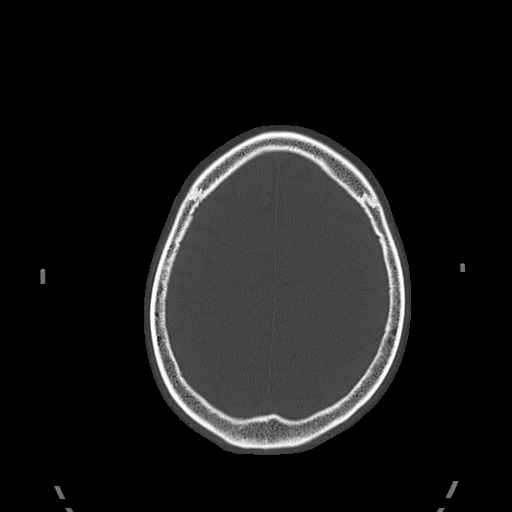
[im 45/72  brain]
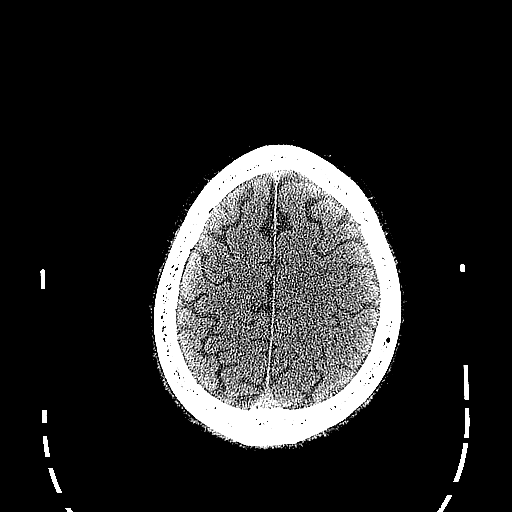
[im 49/72  brain]
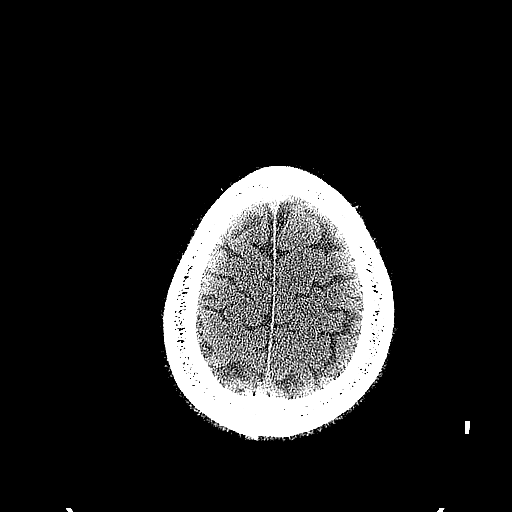
[im 53/72  brain]
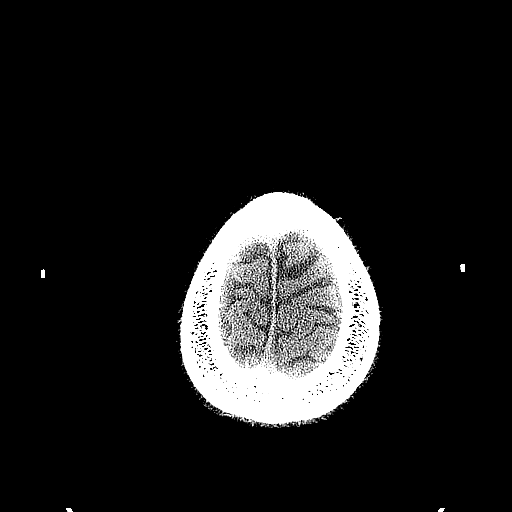
[im 60/72  brain]
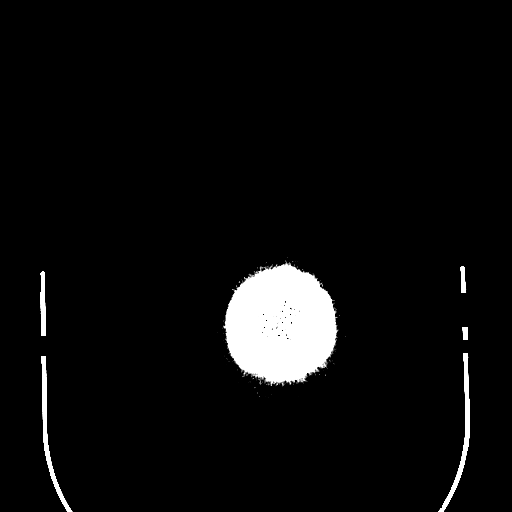
[im 60/72  bone]
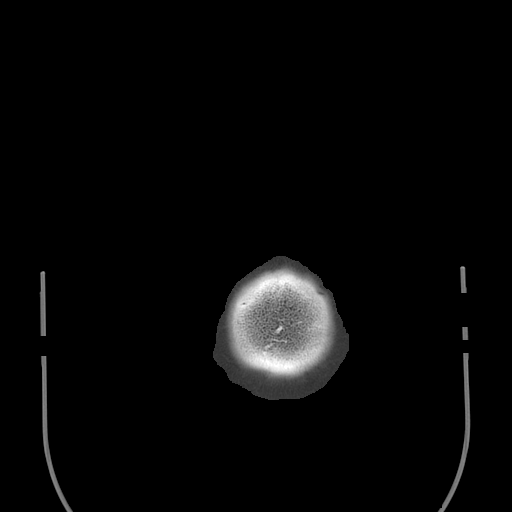
[im 64/72  brain]
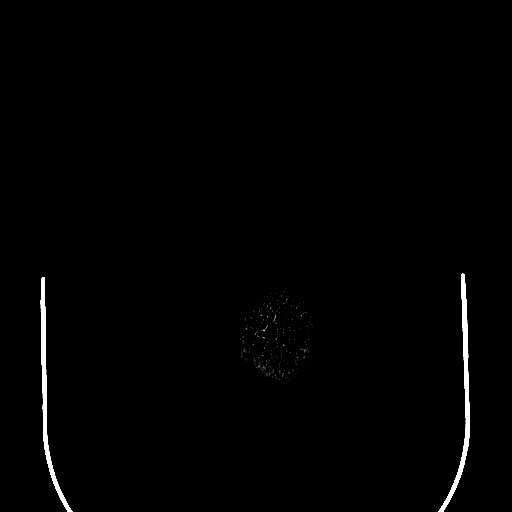
[im 68/72  brain]
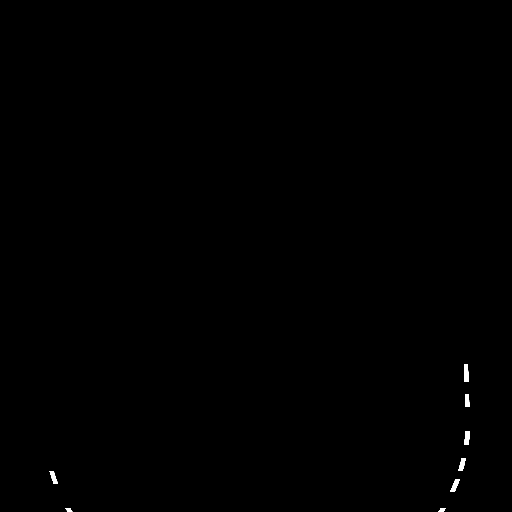

[15 of 30 positions shown; findings below may reference images not displayed]

FINDINGS: There is no evidence of acute infarction, mass lesion, or intra- or
extra-axial hemorrhage on CT.

A chronic right MCA territory infarct is again noted, with
associated encephalomalacia and ex vacuo dilatation of the right
lateral ventricle. This involves portions of the right basal
ganglia.

The brainstem and fourth ventricle are within normal limits. No mass
effect or midline shift is seen.

There is no evidence of fracture; visualized osseous structures are
unremarkable in appearance. The visualized portions of the orbits
are within normal limits. The paranasal sinuses and mastoid air
cells are well-aerated. No significant soft tissue abnormalities are
seen.
IMPRESSION: 1. No acute intracranial pathology seen on CT.
2. Chronic right MCA territory infarct again noted, with associated
encephalomalacia and ex vacuo dilatation of the right lateral
ventricle.

## 2020-06-30 DEATH — deceased
